# Patient Record
Sex: Male | Born: 1995 | Race: White | Hispanic: No | Marital: Single | Smoking: Current some day smoker
Health system: Southern US, Community
[De-identification: ages and names within clinical notes are randomized; demographics above are authoritative.]

## PROBLEM LIST (undated history)

## (undated) DIAGNOSIS — J45909 Unspecified asthma, uncomplicated: Secondary | ICD-10-CM

---
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
## 2013-10-02 ENCOUNTER — Emergency Department: Payer: Self-pay | Admitting: Emergency Medicine

## 2013-10-05 LAB — BETA STREP CULTURE(ARMC)

## 2015-03-18 ENCOUNTER — Encounter: Payer: Self-pay | Admitting: Emergency Medicine

## 2015-03-18 ENCOUNTER — Emergency Department
Admission: EM | Admit: 2015-03-18 | Discharge: 2015-03-18 | Discharge status: Against Medical Advice | Payer: Self-pay | Attending: Emergency Medicine | Admitting: Emergency Medicine

## 2015-03-18 DIAGNOSIS — J069 Acute upper respiratory infection, unspecified: Secondary | ICD-10-CM | POA: Insufficient documentation

## 2015-03-18 DIAGNOSIS — F17200 Nicotine dependence, unspecified, uncomplicated: Secondary | ICD-10-CM | POA: Insufficient documentation

## 2015-03-18 NOTE — ED Notes (Signed)
Cold sx's for couple of days   Cough body aches

## 2016-03-30 ENCOUNTER — Emergency Department: Payer: Self-pay

## 2016-03-30 ENCOUNTER — Emergency Department
Admission: EM | Admit: 2016-03-30 | Discharge: 2016-03-30 | Disposition: A | Discharge status: Home / Self Care | Payer: Self-pay | Attending: Emergency Medicine | Admitting: Emergency Medicine

## 2016-03-30 ENCOUNTER — Encounter: Payer: Self-pay | Admitting: Emergency Medicine

## 2016-03-30 DIAGNOSIS — F17210 Nicotine dependence, cigarettes, uncomplicated: Secondary | ICD-10-CM | POA: Insufficient documentation

## 2016-03-30 DIAGNOSIS — J069 Acute upper respiratory infection, unspecified: Secondary | ICD-10-CM | POA: Insufficient documentation

## 2016-03-30 DIAGNOSIS — B9789 Other viral agents as the cause of diseases classified elsewhere: Secondary | ICD-10-CM

## 2016-03-30 NOTE — ED Provider Notes (Signed)
[facility]: [facility] Emergency Department Provider Note  ____________________________________________   First MD Initiated Contact with Patient 03/30/16 1511     (approximate)  I have reviewed the triage vital signs and the nursing notes.   HISTORY  Chief Complaint URI and Nasal Congestion    HPI Jeremy Chang is a 21 y.o. male is here with complaint of cough and congestion for the last 2 days. Patient states at times he has had some dizziness. He has been taking DayQuil infrequently for total of 2 doses. He states that he does not like taking medication as he has had family members who have "overdosed". He is unaware of any fever but states that he at times feels warm and then cold. He denies any nausea, vomiting, diarrhea. Patient is a smoker but states that he is only smoked 2 cigarettes today as opposed to his normal half pack of cigarettes per day. He denies any history of pneumonia.   History reviewed. No pertinent past medical history.  There are no active problems to display for this patient.   History reviewed. No pertinent surgical history.  Prior to Admission medications   Not on File    Allergies Patient has no known allergies.  No family history on file.  Social History Social History  Substance Use Topics  . Smoking status: Current Every Day Smoker  . Smokeless tobacco: Never Used  . Alcohol use Yes    Review of Systems Constitutional: Subjective fever/chills Eyes: No visual changes. ENT: Positive sore throat. Cardiovascular: Denies chest pain. Respiratory: Denies shortness of breath. Positive cough. Gastrointestinal: No abdominal pain.  No nausea, no vomiting.  No diarrhea.   Musculoskeletal: Positive generalized muscle aches. Skin: Negative for rash. Neurological: Negative for headaches, focal weakness or numbness.  10-point ROS otherwise negative.  ____________________________________________   PHYSICAL EXAM:  VITAL  SIGNS: ED Triage Vitals  Enc Vitals Group     BP 03/30/16 1503 138/76     Pulse Rate 03/30/16 1503 83     Resp 03/30/16 1503 20     Temp 03/30/16 1503 98.6 F (37 C)     Temp Source 03/30/16 1503 Oral     SpO2 03/30/16 1503 99 %     Weight 03/30/16 1504 144 lb (65.3 kg)     Height 03/30/16 1503 5\' 6"  (1.676 m)     Head Circumference --      Peak Flow --      Pain Score --      Pain Loc --      Pain Edu? --      Excl. in GC? --     Constitutional: Alert and oriented. Well appearing and in no acute distress. Eyes: Conjunctivae are normal. PERRL. EOMI. Head: Atraumatic. Nose: Moderate congestion/rhinnorhea.  EACs bilaterally are clear. TMs are dull bilaterally but without injection or erythema. Mouth/Throat: Mucous membranes are moist.  Oropharynx non-erythematous. Mild posterior drainage. Neck: No stridor.   Hematological/Lymphatic/Immunilogical: No cervical lymphadenopathy. Cardiovascular: Normal rate, regular rhythm. Grossly normal heart sounds.  Good peripheral circulation. Respiratory: Normal respiratory effort.  No retractions. Lungs CTAB. Gastrointestinal: Soft and nontender. No distention.  Musculoskeletal: Moves upper and lower extremities without any difficulty. Normal gait was noted. Neurologic:  Normal speech and language. No gross focal neurologic deficits are appreciated. No gait instability. Skin:  Skin is warm, dry and intact. No rash noted. Psychiatric: Mood and affect are normal. Speech and behavior are normal.  ____________________________________________   LABS (all labs ordered are  listed, but only abnormal results are displayed)  Labs Reviewed - No data to display   RADIOLOGY Chest x-ray per radiologist shows no active cardiopulmonary disease. I, Tommi Rumps, personally viewed and evaluated these images (plain radiographs) as part of my medical decision making, as well as reviewing the written report by the  radiologist.  ____________________________________________   PROCEDURES  Procedure(s) performed: None  Procedures  Critical Care performed: No  ____________________________________________   INITIAL IMPRESSION / ASSESSMENT AND PLAN / ED COURSE  Pertinent labs & imaging results that were available during my care of the patient were reviewed by me and considered in my medical decision making (see chart for details).    Clinical Course    Patient is continue taking DayQuil as directed by the package insert. He is to increase fluids. He is also encouraged to take ibuprofen as needed for headache or throat pain. He is aware that DayQuil already has Tylenol in it. Patient states that he needs a note to remain out of work tonight as he is supposed to work.  ____________________________________________   FINAL CLINICAL IMPRESSION(S) / ED DIAGNOSES  Final diagnoses:  Viral URI with cough      NEW MEDICATIONS STARTED DURING THIS VISIT:  New Prescriptions   No medications on file     Note:  This document was prepared using Dragon voice recognition software and may include unintentional dictation errors.    Tommi Rumps, PA-C 03/30/16 1638    Sharman Cheek, MD 04/01/16 [phone]

## 2016-03-30 NOTE — ED Triage Notes (Signed)
Bodyaches, sore throat, fatigue, sweating.

## 2016-03-30 NOTE — ED Notes (Signed)
Took dayquil at 10 am

## 2016-03-30 NOTE — Discharge Instructions (Signed)
Increase fluids. Take ibuprofen as needed for headache, muscle aches or throat pain. Continue taking your DayQuil capsules as directed. [facility] if any continued problems.

## 2016-05-14 ENCOUNTER — Encounter: Payer: Self-pay | Admitting: *Deleted

## 2016-05-14 ENCOUNTER — Emergency Department
Admission: EM | Admit: 2016-05-14 | Discharge: 2016-05-14 | Disposition: A | Discharge status: Home / Self Care | Payer: Self-pay | Attending: Emergency Medicine | Admitting: Emergency Medicine

## 2016-05-14 DIAGNOSIS — F17200 Nicotine dependence, unspecified, uncomplicated: Secondary | ICD-10-CM | POA: Insufficient documentation

## 2016-05-14 DIAGNOSIS — B349 Viral infection, unspecified: Secondary | ICD-10-CM | POA: Insufficient documentation

## 2016-05-14 DIAGNOSIS — L219 Seborrheic dermatitis, unspecified: Secondary | ICD-10-CM | POA: Insufficient documentation

## 2016-05-14 MED ORDER — GUAIFENESIN-CODEINE 100-10 MG/5ML PO SYRP: 5.0000 mL | ORAL_SOLUTION | Freq: Three times a day (TID) | ORAL | 0 refills | Status: DC | PRN

## 2016-05-14 MED ORDER — PROMETHAZINE HCL 12.5 MG PO TABS: 12.5000 mg | ORAL_TABLET | Freq: Four times a day (QID) | ORAL | 0 refills | Status: DC | PRN

## 2016-05-14 MED ORDER — IBUPROFEN 600 MG PO TABS: 600.0000 mg | ORAL_TABLET | Freq: Four times a day (QID) | ORAL | 0 refills | Status: DC | PRN

## 2016-05-14 NOTE — ED Provider Notes (Signed)
[facility] Emergency Department Provider Note  ____________________________________________  Time seen: Approximately 4:59 PM  I have reviewed the triage vital signs and the nursing notes.   HISTORY  Chief Complaint Chills and Cough   HPI Jeremy Chang is a 21 y.o. male presents to the emergency department for evaluation of nausea, body aches, chills, cough and fever. Symptoms started earlier today. He works in [employer] and has likely had exposure to influenza. He did not receive a flu shot this year. He has not taken any medications for his symptoms. He is also concerned about his dandruff. He states that it has gotten bad the last few weeks and is unsure of what to use.   History reviewed. No pertinent past medical history.  There are no active problems to display for this patient.   History reviewed. No pertinent surgical history.  Prior to Admission medications   Medication Sig Start Date End Date Taking? Authorizing Provider  guaiFENesin-codeine (ROBITUSSIN AC) 100-10 MG/5ML syrup Take 5 mLs by mouth 3 (three) times daily as needed for cough. 05/14/16   Chinita Pesterari B Melaina Howerton, FNP  ibuprofen (ADVIL,MOTRIN) 600 MG tablet Take 1 tablet (600 mg total) by mouth every 6 (six) hours as needed. 05/14/16   Chinita Pesterari B Saniyya Gau, FNP  promethazine (PHENERGAN) 12.5 MG tablet Take 1 tablet (12.5 mg total) by mouth every 6 (six) hours as needed for nausea or vomiting. 05/14/16   Chinita Pesterari B Nicholson Starace, FNP    Allergies Patient has no known allergies.  History reviewed. No pertinent family history.  Social History Social History  Substance Use Topics  . Smoking status: Current Every Day Smoker  . Smokeless tobacco: Never Used  . Alcohol use Yes    Review of Systems Constitutional: Positive for fever/chills ENT: Positive for sore throat. Cardiovascular: Denies chest pain. Respiratory: Negative for shortness of breath. Positive for cough. Gastrointestinal: Positive  for nausea,  negative for vomiting.  Negative for diarrhea.  Musculoskeletal: Positive for body aches Skin: Negative for rash. Positive for dandruff. Neurological: Positive for headaches ____________________________________________   PHYSICAL EXAM:  VITAL SIGNS: ED Triage Vitals [05/14/16 1650]  Enc Vitals Group     BP (!) 151/66     Pulse Rate 69     Resp 18     Temp 99.6 F (37.6 C)     Temp Source Oral     SpO2 100 %     Weight 130 lb (59 kg)     Height 5\' 7"  (1.702 m)     Head Circumference      Peak Flow      Pain Score      Pain Loc      Pain Edu?      Excl. in GC?     Constitutional: Alert and oriented. Acutely ill appearing and in no acute distress. Eyes: Conjunctivae are normal. EOMI. Ears: Bilateral TM are normal Nose: Nasal congestion; no rhinnorhea. Mouth/Throat: Mucous membranes are moist.  Oropharynx mildly erythematous. Tonsils appear normal. Neck: No stridor.  Lymphatic: Bilateral anterior cervical lymphadenopathy. Cardiovascular: Normal rate, regular rhythm. Grossly normal heart sounds.  Good peripheral circulation. Respiratory: Normal respiratory effort.  No retractions. Breath sounds clear to auscultation throughout. Gastrointestinal: Soft and nontender.  Musculoskeletal: FROM x 4 extremities.  Neurologic:  Normal speech and language.  Skin:  Skin is warm, dry and intact. No rash noted. Psychiatric: Mood and affect are normal. Speech and behavior are normal. ___________________________________________   LABS (all labs ordered are listed,  but only abnormal results are displayed)  Labs Reviewed - No data to display ____________________________________________  EKG  Not indicated. ____________________________________________  RADIOLOGY  Not indicated. ____________________________________________   PROCEDURES  Procedure(s) performed: None  Critical Care performed: No  ____________________________________________   INITIAL  IMPRESSION / ASSESSMENT AND PLAN / ED COURSE     Pertinent labs & imaging results that were available during my care of the patient were reviewed by me and considered in my medical decision making (see chart for details).   21 year old male presenting to the ER for evaluation of viral symptoms and seborrheic dermatitis of the scalp. He was advised to use tea tree oil shampoo and see a dermatologist if dandruff is not improving. He was advised to take Robitussin AC for cough as needed.  ____________________________________________   FINAL CLINICAL IMPRESSION(S) / ED DIAGNOSES  Final diagnoses:  Acute viral syndrome  Seborrheic dermatitis of scalp    Note:  This document was prepared using Dragon voice recognition software and may include unintentional dictation errors.     Chinita Pester, FNP 05/15/16 2257    Emily Filbert, MD 05/20/16 [phone]

## 2016-05-14 NOTE — Discharge Instructions (Signed)
Take the phenergan for nausea. Take the ibuprofen for body aches or fever. Take the Robitussin AC for cough.  Use Tea Tree Oil shampoo for dandruff.  Return to the ER for symptoms that change or worsen if unable to schedule an appointment with primary care.

## 2016-05-14 NOTE — ED Notes (Signed)
See triage note   Developed fever ,chills,body aches since yesterday   Low grade fever on arrival

## 2016-05-14 NOTE — ED Triage Notes (Signed)
States fever, chills, nausea and body aches since yesterday

## 2016-08-30 ENCOUNTER — Encounter: Payer: Self-pay | Admitting: Emergency Medicine

## 2016-08-30 ENCOUNTER — Emergency Department
Admission: EM | Admit: 2016-08-30 | Discharge: 2016-08-30 | Disposition: A | Discharge status: Home / Self Care | Payer: Self-pay | Attending: Emergency Medicine | Admitting: Emergency Medicine

## 2016-08-30 DIAGNOSIS — J069 Acute upper respiratory infection, unspecified: Secondary | ICD-10-CM | POA: Insufficient documentation

## 2016-08-30 DIAGNOSIS — F17200 Nicotine dependence, unspecified, uncomplicated: Secondary | ICD-10-CM | POA: Insufficient documentation

## 2016-08-30 LAB — POCT RAPID STREP A: STREPTOCOCCUS, GROUP A SCREEN (DIRECT): NEGATIVE

## 2016-08-30 MED ORDER — LIDOCAINE VISCOUS 2 % MT SOLN
15.0000 mL | Freq: Once | OROMUCOSAL | Status: AC
  Administered 2016-08-30: 15 mL via OROMUCOSAL
  Filled 2016-08-30: qty 15

## 2016-08-30 MED ORDER — BENZONATATE 100 MG PO CAPS: 100.0000 mg | ORAL_CAPSULE | Freq: Three times a day (TID) | ORAL | 0 refills | Status: AC | PRN

## 2016-08-30 MED ORDER — LIDOCAINE VISCOUS 2 % MT SOLN: 10.0000 mL | OROMUCOSAL | 0 refills | Status: DC | PRN

## 2016-08-30 MED ORDER — ALBUTEROL SULFATE HFA 108 (90 BASE) MCG/ACT IN AERS: 2.0000 | INHALATION_SPRAY | Freq: Four times a day (QID) | RESPIRATORY_TRACT | 0 refills | Status: DC | PRN

## 2016-08-30 MED ORDER — IPRATROPIUM-ALBUTEROL 0.5-2.5 (3) MG/3ML IN SOLN
3.0000 mL | Freq: Once | RESPIRATORY_TRACT | Status: AC
  Administered 2016-08-30: 3 mL via RESPIRATORY_TRACT
  Filled 2016-08-30: qty 3

## 2016-08-30 NOTE — ED Provider Notes (Signed)
[facility] Emergency Department Provider Note  ____________________________________________  Time seen: Approximately 10:22 PM  I have reviewed the triage vital signs and the nursing notes.   HISTORY  Chief Complaint Cough and Sore Throat    HPI Jeremy Chang is a 21 y.o. male that presents to the emergency department with one day of sore throat and nonproductive cough. He is eating and drinking normally.He has a 34 month old baby and is concerned that she will get sick. No alleviating measures have been attempted. He smokes half pack a day. He has a history of asthma and does not use any inhalers. No allergies. No fever, SOB, CP, nausea, vomiting, abdominal pain, diarrhea, constipation.    History reviewed. No pertinent past medical history.  There are no active problems to display for this patient.   History reviewed. No pertinent surgical history.  Prior to Admission medications   Medication Sig Start Date End Date Taking? Authorizing Provider  albuterol (PROVENTIL HFA;VENTOLIN HFA) 108 (90 Base) MCG/ACT inhaler Inhale 2 puffs into the lungs every 6 (six) hours as needed for wheezing or shortness of breath. 08/30/16   Enid Derry, PA-C  benzonatate (TESSALON PERLES) 100 MG capsule Take 1 capsule (100 mg total) by mouth 3 (three) times daily as needed for cough. 08/30/16 08/30/17  Enid Derry, PA-C  guaiFENesin-codeine (ROBITUSSIN AC) 100-10 MG/5ML syrup Take 5 mLs by mouth 3 (three) times daily as needed for cough. 05/14/16   Triplett, Rulon Eisenmenger B, FNP  ibuprofen (ADVIL,MOTRIN) 600 MG tablet Take 1 tablet (600 mg total) by mouth every 6 (six) hours as needed. 05/14/16   Triplett, Cari B, FNP  lidocaine (XYLOCAINE) 2 % solution Use as directed 10 mLs in the mouth or throat as needed for mouth pain. 08/30/16   Enid Derry, PA-C  promethazine (PHENERGAN) 12.5 MG tablet Take 1 tablet (12.5 mg total) by mouth every 6 (six) hours as needed for nausea or  vomiting. 05/14/16   Kem Boroughs B, FNP    Allergies Patient has no known allergies.  History reviewed. No pertinent family history.  Social History Social History  Substance Use Topics  . Smoking status: Current Every Day Smoker  . Smokeless tobacco: Never Used  . Alcohol use Yes     Review of Systems  Constitutional: No fever/chills Eyes: No visual changes. No discharge. ENT: Negative for congestion and rhinorrhea. Cardiovascular: No chest pain. Respiratory: Positive for cough. No SOB. Gastrointestinal: No abdominal pain.  No nausea, no vomiting.  No diarrhea.  No constipation. Musculoskeletal: Negative for musculoskeletal pain. Skin: Negative for rash, abrasions, lacerations, ecchymosis. Neurological: Negative for headaches.   ____________________________________________   PHYSICAL EXAM:  VITAL SIGNS: ED Triage Vitals  Enc Vitals Group     BP 08/30/16 2138 113/78     Pulse Rate 08/30/16 2138 76     Resp 08/30/16 2138 18     Temp 08/30/16 2138 98.4 F (36.9 C)     Temp Source 08/30/16 2138 Oral     SpO2 08/30/16 2138 99 %     Weight 08/30/16 2139 135 lb (61.2 kg)     Height 08/30/16 2139 5\' 6"  (1.676 m)     Head Circumference --      Peak Flow --      Pain Score 08/30/16 2138 3     Pain Loc --      Pain Edu? --      Excl. in GC? --      Constitutional: Alert and  oriented. Well appearing and in no acute distress. Eyes: Conjunctivae are normal. PERRL. EOMI. No discharge. Head: Atraumatic. ENT: No frontal and maxillary sinus tenderness.      Ears: Tympanic membranes pearly gray with good landmarks. No discharge.      Nose: No congestion/rhinnorhea.      Mouth/Throat: Mucous membranes are moist. Oropharynx non-erythematous. Tonsils not enlarged. No exudates. Uvula midline. Neck: No stridor.   Hematological/Lymphatic/Immunilogical: No cervical lymphadenopathy. Cardiovascular: Normal rate, regular rhythm.  Good peripheral circulation. Respiratory: Normal  respiratory effort without tachypnea or retractions. Lungs CTAB. Good air entry to the bases with no decreased or absent breath sounds. Gastrointestinal: Bowel sounds 4 quadrants. Soft and nontender to palpation. No guarding or rigidity. No palpable masses. No distention. Musculoskeletal: Full range of motion to all extremities. No gross deformities appreciated. Neurologic:  Normal speech and language. No gross focal neurologic deficits are appreciated.  Skin:  Skin is warm, dry and intact. No rash noted.   ____________________________________________   LABS (all labs ordered are listed, but only abnormal results are displayed)  Labs Reviewed  CULTURE, GROUP A STREP [facility])  POCT RAPID STREP A   ____________________________________________  EKG   ____________________________________________  RADIOLOGY  No results found.  ____________________________________________    PROCEDURES  Procedure(s) performed:    Procedures    Medications  ipratropium-albuterol (DUONEB) 0.5-2.5 (3) MG/3ML nebulizer solution 3 mL (3 mLs Nebulization Given 08/30/16 2255)  lidocaine (XYLOCAINE) 2 % viscous mouth solution 15 mL (15 mLs Mouth/Throat Given 08/30/16 2312)     ____________________________________________   INITIAL IMPRESSION / ASSESSMENT AND PLAN / ED COURSE  Pertinent labs & imaging results that were available during my care of the patient were reviewed by me and considered in my medical decision making (see chart for details).  Review of the [place] CSRS was performed in accordance of the [facility] prior to dispensing any controlled drugs.     Patient's diagnosis is consistent with upper respiratory tract infection. Vital signs and exam are reassuring. Strep test negative. This is likely viral. Patient was given DuoNeb and viscous lidocaine. He appears well, and he is eating a popsicle. Patient feels comfortable going home. Patient will be discharged home with prescriptions for  Tessalon Perles, viscous lidocaine, albuterol inhaler. Patient is to follow up with PCP as needed or otherwise directed. Patient is given ED precautions to return to the ED for any worsening or new symptoms.     ____________________________________________  FINAL CLINICAL IMPRESSION(S) / ED DIAGNOSES  Final diagnoses:  Viral upper respiratory tract infection      NEW MEDICATIONS STARTED DURING THIS VISIT:  Discharge Medication List as of 08/30/2016 10:39 PM    START taking these medications   Details  albuterol (PROVENTIL HFA;VENTOLIN HFA) 108 (90 Base) MCG/ACT inhaler Inhale 2 puffs into the lungs every 6 (six) hours as needed for wheezing or shortness of breath., Starting Sun 08/30/2016, Print    benzonatate (TESSALON PERLES) 100 MG capsule Take 1 capsule (100 mg total) by mouth 3 (three) times daily as needed for cough., Starting Sun 08/30/2016, Until Mon 08/30/2017, Print    lidocaine (XYLOCAINE) 2 % solution Use as directed 10 mLs in the mouth or throat as needed for mouth pain., Starting Sun 08/30/2016, Print            This chart was dictated using voice recognition software/Dragon. Despite best efforts to proofread, errors can occur which can change the meaning. Any change was purely unintentional.    Enid Derry, PA-C 08/30/16  2330    Rockne MenghiniNorman, Anne-Caroline, MD 08/30/16 [phone]

## 2016-08-30 NOTE — ED Triage Notes (Signed)
Pt ambulatory to triage with no difficulty. Pt reports started this morning with a dry hacking cough. Then developed a sore throat. Pt talking in full and complete sentences with no difficulty at this time.

## 2016-08-30 NOTE — ED Notes (Signed)
POCT Rapid Strep A completed and sent for analysis.

## 2016-09-03 LAB — CULTURE, GROUP A STREP (THRC)

## 2016-10-02 ENCOUNTER — Emergency Department
Admission: EM | Admit: 2016-10-02 | Discharge: 2016-10-02 | Disposition: A | Discharge status: Home / Self Care | Payer: Self-pay | Attending: Emergency Medicine | Admitting: Emergency Medicine

## 2016-10-02 DIAGNOSIS — F17200 Nicotine dependence, unspecified, uncomplicated: Secondary | ICD-10-CM | POA: Insufficient documentation

## 2016-10-02 DIAGNOSIS — J45909 Unspecified asthma, uncomplicated: Secondary | ICD-10-CM | POA: Insufficient documentation

## 2016-10-02 DIAGNOSIS — J069 Acute upper respiratory infection, unspecified: Secondary | ICD-10-CM | POA: Insufficient documentation

## 2016-10-02 HISTORY — DX: Unspecified asthma, uncomplicated: J45.909

## 2016-10-02 NOTE — ED Triage Notes (Signed)
-  pt c/o sore throat for the past couple of days, states every time he goes to the pool this happens.

## 2016-10-02 NOTE — ED Provider Notes (Signed)
[facility] Emergency Department Provider Note  ____________________________________________  Time seen: Approximately 5:40 PM  I have reviewed the triage vital signs and the nursing notes.   HISTORY  Chief Complaint Sore Throat    HPI Jeremy Chang is a 21 y.o. male presenting to the emergency department with pharyngitis that started today. Patient has been drinking and managing his own secretions. He has been afebrile. Patient denies shortness of breath, chest pain, chest tightness, nausea, vomiting or abdominal pain. He denies rhinorrhea and congestion. He has had nonproductive cough. No alleviating measures have been attempted.   Past Medical History:  Diagnosis Date  . Asthma     There are no active problems to display for this patient.   History reviewed. No pertinent surgical history.  Prior to Admission medications   Medication Sig Start Date End Date Taking? Authorizing Provider  albuterol (PROVENTIL HFA;VENTOLIN HFA) 108 (90 Base) MCG/ACT inhaler Inhale 2 puffs into the lungs every 6 (six) hours as needed for wheezing or shortness of breath. 08/30/16   Enid Derry, PA-C  benzonatate (TESSALON PERLES) 100 MG capsule Take 1 capsule (100 mg total) by mouth 3 (three) times daily as needed for cough. 08/30/16 08/30/17  Enid Derry, PA-C  guaiFENesin-codeine (ROBITUSSIN AC) 100-10 MG/5ML syrup Take 5 mLs by mouth 3 (three) times daily as needed for cough. 05/14/16   Triplett, Rulon Eisenmenger B, FNP  ibuprofen (ADVIL,MOTRIN) 600 MG tablet Take 1 tablet (600 mg total) by mouth every 6 (six) hours as needed. 05/14/16   Triplett, Cari B, FNP  lidocaine (XYLOCAINE) 2 % solution Use as directed 10 mLs in the mouth or throat as needed for mouth pain. 08/30/16   Enid Derry, PA-C  promethazine (PHENERGAN) 12.5 MG tablet Take 1 tablet (12.5 mg total) by mouth every 6 (six) hours as needed for nausea or vomiting. 05/14/16   Kem Boroughs B, FNP    Allergies Patient  has no known allergies.  No family history on file.  Social History Social History  Substance Use Topics  . Smoking status: Current Every Day Smoker  . Smokeless tobacco: Never Used  . Alcohol use Yes     Review of Systems  Constitutional: No fever/chills Eyes: No visual changes. No discharge ENT: Patient has pharyngitis and nonproductive cough. Cardiovascular: no chest pain. Respiratory: no cough. No SOB. Gastrointestinal: No abdominal pain.  No nausea, no vomiting.  No diarrhea.  No constipation. Genitourinary: Negative for dysuria. No hematuria Musculoskeletal: Negative for musculoskeletal pain. Skin: Negative for rash, abrasions, lacerations, ecchymosis. ____________________________________________   PHYSICAL EXAM:  VITAL SIGNS: ED Triage Vitals  Enc Vitals Group     BP 10/02/16 1703 (!) 116/52     Pulse Rate 10/02/16 1703 65     Resp 10/02/16 1703 16     Temp 10/02/16 1703 98.7 F (37.1 C)     Temp Source 10/02/16 1703 Oral     SpO2 10/02/16 1703 100 %     Weight 10/02/16 1704 135 lb (61.2 kg)     Height 10/02/16 1704 5\' 5"  (1.651 m)     Head Circumference --      Peak Flow --      Pain Score 10/02/16 1703 3     Pain Loc --      Pain Edu? --      Excl. in GC? --      Constitutional: Alert and oriented. Well appearing and in no acute distress. Eyes: Conjunctivae are normal. PERRL. EOMI. Head: Atraumatic.  ENT:      Ears: Tympanic membranes are pearly bilaterally.      Nose: No congestion/rhinnorhea.      Mouth/Throat: Posterior pharynx is nonerythematous. Uvula is midline.  Neck: Full range of motion Hematological/Lymphatic/Immunilogical: No cervical lymphadenopathy.  Cardiovascular: Normal rate, regular rhythm. Normal S1 and S2.  Good peripheral circulation. Respiratory: Normal respiratory effort without tachypnea or retractions. Lungs CTAB. Good air entry to the bases with no decreased or absent breath sounds. Gastrointestinal: Bowel sounds 4  quadrants. Soft and nontender to palpation. No guarding or rigidity. No palpable masses. No distention. No CVA tenderness. Musculoskeletal: Full range of motion to all extremities. No gross deformities appreciated. Neurologic:  Normal speech and language. No gross focal neurologic deficits are appreciated.  Skin:  Skin is warm, dry and intact. No rash noted. Psychiatric: Mood and affect are normal. Speech and behavior are normal. Patient exhibits appropriate insight and judgement.   ____________________________________________   LABS (all labs ordered are listed, but only abnormal results are displayed)  Labs Reviewed - No data to display ____________________________________________  EKG   ____________________________________________  RADIOLOGY   No results found.  ____________________________________________    PROCEDURES  Procedure(s) performed:    Procedures    Medications - No data to display   ____________________________________________   INITIAL IMPRESSION / ASSESSMENT AND PLAN / ED COURSE  Pertinent labs & imaging results that were available during my care of the patient were reviewed by me and considered in my medical decision making (see chart for details).  Review of the [place] CSRS was performed in accordance of the [facility] prior to dispensing any controlled drugs.     Assessment and plan: Viral URI Patient presents to the emergency department with pharyngitis and nonproductive cough for one day. Patient has been afebrile with reassuring physical exam. Rest and hydration were encouraged. Patient was advised to follow-up with primary care as needed. All patient questions were answered.   ____________________________________________  FINAL CLINICAL IMPRESSION(S) / ED DIAGNOSES  Final diagnoses:  Viral upper respiratory tract infection      NEW MEDICATIONS STARTED DURING THIS VISIT:  New Prescriptions   No medications on file        This  chart was dictated using voice recognition software/Dragon. Despite best efforts to proofread, errors can occur which can change the meaning. Any change was purely unintentional.    Orvil FeilWoods, Emira Eubanks M, PA-C 10/02/16 1746    Pia MauWoods, Lillian Tigges [place]ite HillM, PA-C 10/02/16 1750    Sharman CheekStafford, Phillip, MD 10/06/16 [phone]

## 2016-11-06 ENCOUNTER — Emergency Department
Admission: EM | Admit: 2016-11-06 | Discharge: 2016-11-06 | Disposition: A | Discharge status: Home / Self Care | Payer: Self-pay | Attending: Emergency Medicine | Admitting: Emergency Medicine

## 2016-11-06 ENCOUNTER — Encounter: Payer: Self-pay | Admitting: Emergency Medicine

## 2016-11-06 DIAGNOSIS — S8391XA Sprain of unspecified site of right knee, initial encounter: Secondary | ICD-10-CM | POA: Insufficient documentation

## 2016-11-06 DIAGNOSIS — Y929 Unspecified place or not applicable: Secondary | ICD-10-CM | POA: Insufficient documentation

## 2016-11-06 DIAGNOSIS — J45909 Unspecified asthma, uncomplicated: Secondary | ICD-10-CM | POA: Insufficient documentation

## 2016-11-06 DIAGNOSIS — F17200 Nicotine dependence, unspecified, uncomplicated: Secondary | ICD-10-CM | POA: Insufficient documentation

## 2016-11-06 DIAGNOSIS — Y9301 Activity, walking, marching and hiking: Secondary | ICD-10-CM | POA: Insufficient documentation

## 2016-11-06 DIAGNOSIS — W19XXXA Unspecified fall, initial encounter: Secondary | ICD-10-CM

## 2016-11-06 DIAGNOSIS — Y999 Unspecified external cause status: Secondary | ICD-10-CM | POA: Insufficient documentation

## 2016-11-06 DIAGNOSIS — S8991XA Unspecified injury of right lower leg, initial encounter: Secondary | ICD-10-CM

## 2016-11-06 DIAGNOSIS — W010XXA Fall on same level from slipping, tripping and stumbling without subsequent striking against object, initial encounter: Secondary | ICD-10-CM | POA: Insufficient documentation

## 2016-11-06 MED ORDER — IBUPROFEN 600 MG PO TABS: 600.0000 mg | ORAL_TABLET | Freq: Four times a day (QID) | ORAL | 0 refills | Status: DC | PRN

## 2016-11-06 MED ORDER — CYCLOBENZAPRINE HCL 5 MG PO TABS: 5.0000 mg | ORAL_TABLET | Freq: Three times a day (TID) | ORAL | 0 refills | Status: AC | PRN

## 2016-11-06 NOTE — ED Triage Notes (Signed)
C/O leg and back cramps x 1 day.  States fell off of girlfriends porch last night (2-3 feet) and woke up this morning with leg and back pain.

## 2016-11-06 NOTE — ED Notes (Signed)
Pt. Going home with friend 

## 2016-11-06 NOTE — ED Provider Notes (Signed)
[facility] Emergency Department Provider Note  ____________________________________________  Time seen: Approximately 5:26 PM  I have reviewed the triage vital signs and the nursing notes.   HISTORY  Chief Complaint Fall    HPI Jeremy Chang is a 21 y.o. male that presents to the emergency department with low back pain and right upper leg pain for one day. Patient states that he fell off of his girlfriend's porch (about 2-3 feet) last night. He originally did not have any pain.He is having pain in the back of his leg that is worse with bending the knee. It feels like the back of his leg as cramping.  He has been walking around but with pain. He has been rubbing icy hot on his back, which is helping. He is concerned that he tore a ligament in his knee. He did not hit his head or lose consciousness.  No bowel or bladder dysfunction or saddle paresthesias. No shortness breath, chest pain, nausea, vomiting, abdominal pain.   Past Medical History:  Diagnosis Date  . Asthma     There are no active problems to display for this patient.   History reviewed. No pertinent surgical history.  Prior to Admission medications   Medication Sig Start Date End Date Taking? Authorizing Provider  albuterol (PROVENTIL HFA;VENTOLIN HFA) 108 (90 Base) MCG/ACT inhaler Inhale 2 puffs into the lungs every 6 (six) hours as needed for wheezing or shortness of breath. 08/30/16   Enid Derry, PA-C  benzonatate (TESSALON PERLES) 100 MG capsule Take 1 capsule (100 mg total) by mouth 3 (three) times daily as needed for cough. 08/30/16 08/30/17  Enid Derry, PA-C  cyclobenzaprine (FLEXERIL) 5 MG tablet Take 1 tablet (5 mg total) by mouth 3 (three) times daily as needed for muscle spasms. 11/06/16 11/13/16  Enid Derry, PA-C  guaiFENesin-codeine (ROBITUSSIN AC) 100-10 MG/5ML syrup Take 5 mLs by mouth 3 (three) times daily as needed for cough. 05/14/16   Triplett, Rulon Eisenmenger B, FNP  ibuprofen  (ADVIL,MOTRIN) 600 MG tablet Take 1 tablet (600 mg total) by mouth every 6 (six) hours as needed. 11/06/16   Enid Derry, PA-C  lidocaine (XYLOCAINE) 2 % solution Use as directed 10 mLs in the mouth or throat as needed for mouth pain. 08/30/16   Enid Derry, PA-C  promethazine (PHENERGAN) 12.5 MG tablet Take 1 tablet (12.5 mg total) by mouth every 6 (six) hours as needed for nausea or vomiting. 05/14/16   Kem Boroughs B, FNP    Allergies Patient has no known allergies.  No family history on file.  Social History Social History  Substance Use Topics  . Smoking status: Current Every Day Smoker  . Smokeless tobacco: Never Used  . Alcohol use Yes     Review of Systems  Constitutional: No fever/chills Cardiovascular: No chest pain. Respiratory:  No SOB. Gastrointestinal: No abdominal pain.  No nausea, no vomiting.  Musculoskeletal: Positive for leg pain. Skin: Negative for rash, abrasions, lacerations, ecchymosis. Neurological: Negative for headaches, numbness or tingling   ____________________________________________   PHYSICAL EXAM:  VITAL SIGNS: ED Triage Vitals  Enc Vitals Group     BP 11/06/16 1620 122/72     Pulse Rate 11/06/16 1620 80     Resp 11/06/16 1620 16     Temp 11/06/16 1620 98.8 F (37.1 C)     Temp Source 11/06/16 1620 Oral     SpO2 11/06/16 1620 97 %     Weight 11/06/16 1619 135 lb (61.2 kg)  Height 11/06/16 1619 5\' 6"  (1.676 m)     Head Circumference --      Peak Flow --      Pain Score 11/06/16 1619 5     Pain Loc --      Pain Edu? --      Excl. in GC? --      Constitutional: Alert and oriented. Well appearing and in no acute distress. Eyes: Conjunctivae are normal. PERRL. EOMI. Head: Atraumatic. ENT:      Ears:      Nose: No congestion/rhinnorhea.      Mouth/Throat: Mucous membranes are moist.  Neck: No stridor.  Cardiovascular: Normal rate, regular rhythm.  Good peripheral circulation. Respiratory: Normal respiratory effort  without tachypnea or retractions. Lungs CTAB. Good air entry to the bases with no decreased or absent breath sounds. Musculoskeletal: No gross deformities appreciated. Tenderness to palpation over right inferior hamstring, right popliteal fossa, and over right lateral knee. Pain in the back of his knee with flexion at knee. No effusion noted. Negative anterior drawer, posterior drawer, valgus, varus, mcMurray, patella apprehension, apley grind. No tenderness to palpation over lumbar spine. Tenderness to palpation over lumbar paraspinal muscles. Full range of motion of back. Neurologic:  Normal speech and language. No gross focal neurologic deficits are appreciated.  Skin:  Skin is warm, dry and intact. No rash noted.   ____________________________________________   LABS (all labs ordered are listed, but only abnormal results are displayed)  Labs Reviewed - No data to display ____________________________________________  EKG   ____________________________________________  RADIOLOGY  No results found.  ____________________________________________    PROCEDURES  Procedure(s) performed:    Procedures    Medications - No data to display   ____________________________________________   INITIAL IMPRESSION / ASSESSMENT AND PLAN / ED COURSE  Pertinent labs & imaging results that were available during my care of the patient were reviewed by me and considered in my medical decision making (see chart for details).  Review of the [place] CSRS was performed in accordance of the [facility] prior to dispensing any controlled drugs.   Patient presented to the emergency department for evaluation of back pain and leg pain after fall. Vital signs and exam are reassuring. Patient is not having any tenderness to palpation over lumbar spine. Patient did not wish to have knee or back x-rays. Knee was ace wrapped. Patient will be discharged home with prescriptions for Flexeril and ibuprofen. Patient is  to follow up with PCP as directed. Patient is given ED precautions to return to the ED for any worsening or new symptoms.     ____________________________________________  FINAL CLINICAL IMPRESSION(S) / ED DIAGNOSES  Final diagnoses:  Injury of right knee, initial encounter  Fall, initial encounter  Sprain of right knee, unspecified ligament, initial encounter      NEW MEDICATIONS STARTED DURING THIS VISIT:  Discharge Medication List as of 11/06/2016  5:39 PM    START taking these medications   Details  cyclobenzaprine (FLEXERIL) 5 MG tablet Take 1 tablet (5 mg total) by mouth 3 (three) times daily as needed for muscle spasms., Starting Fri 11/06/2016, Until Fri 11/13/2016, Print            This chart was dictated using voice recognition software/Dragon. Despite best efforts to proofread, errors can occur which can change the meaning. Any change was purely unintentional.    Enid DerryWagner, Keven Soucy, PA-C 11/07/16 1708    Jene EveryKinner, Robert, MD 11/07/16 [phone]

## 2017-03-12 IMAGING — CR DG CHEST 2V
1 series · 2 of 2 positions shown · non-contrast
Comparison: Report from 12/26/1997

CLINICAL DATA: Productive cough, dyspnea and dizziness x2 days.

EXAM:
CHEST  2 VIEW

[Series 1: dg chest 2 view · 0.14mm/px · 2 of 2 slices shown]
[im 1/2]
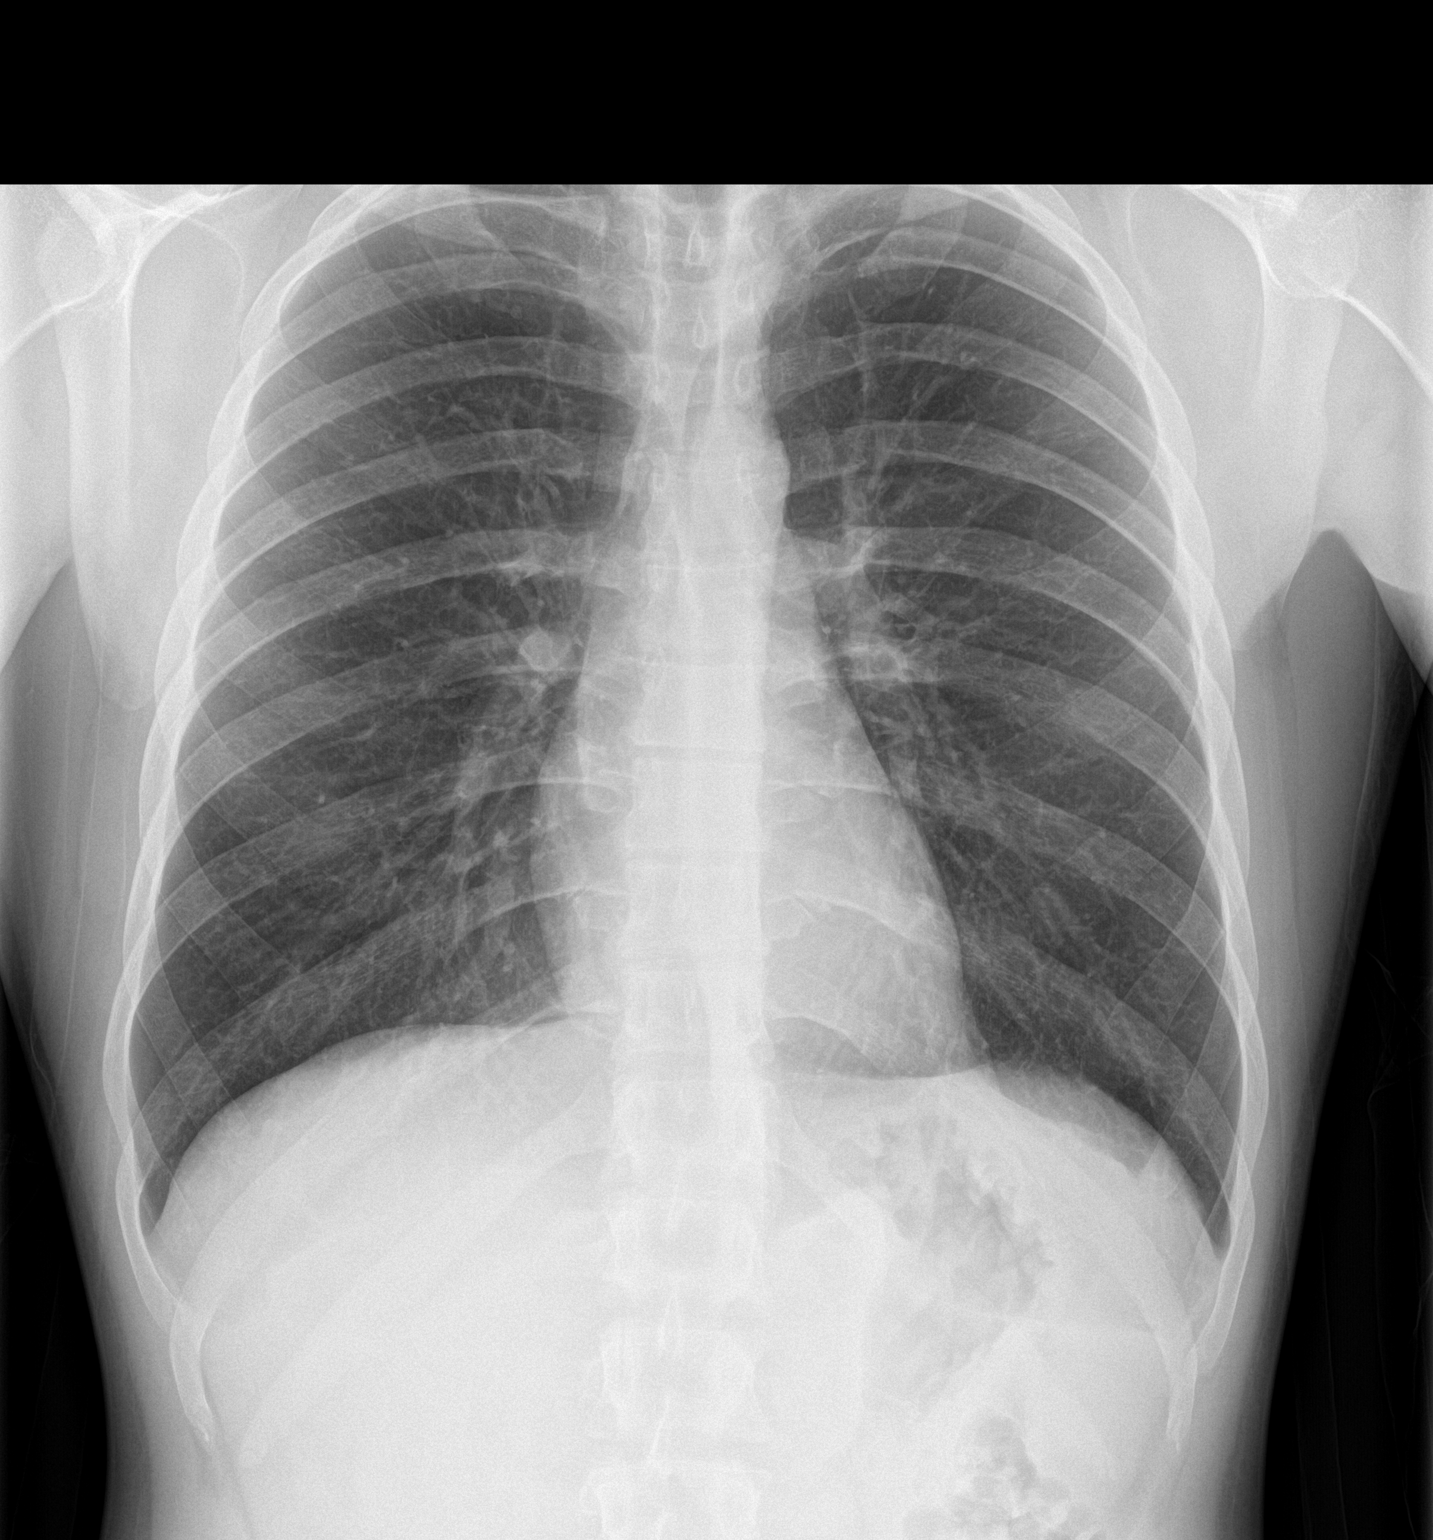
[im 2/2]
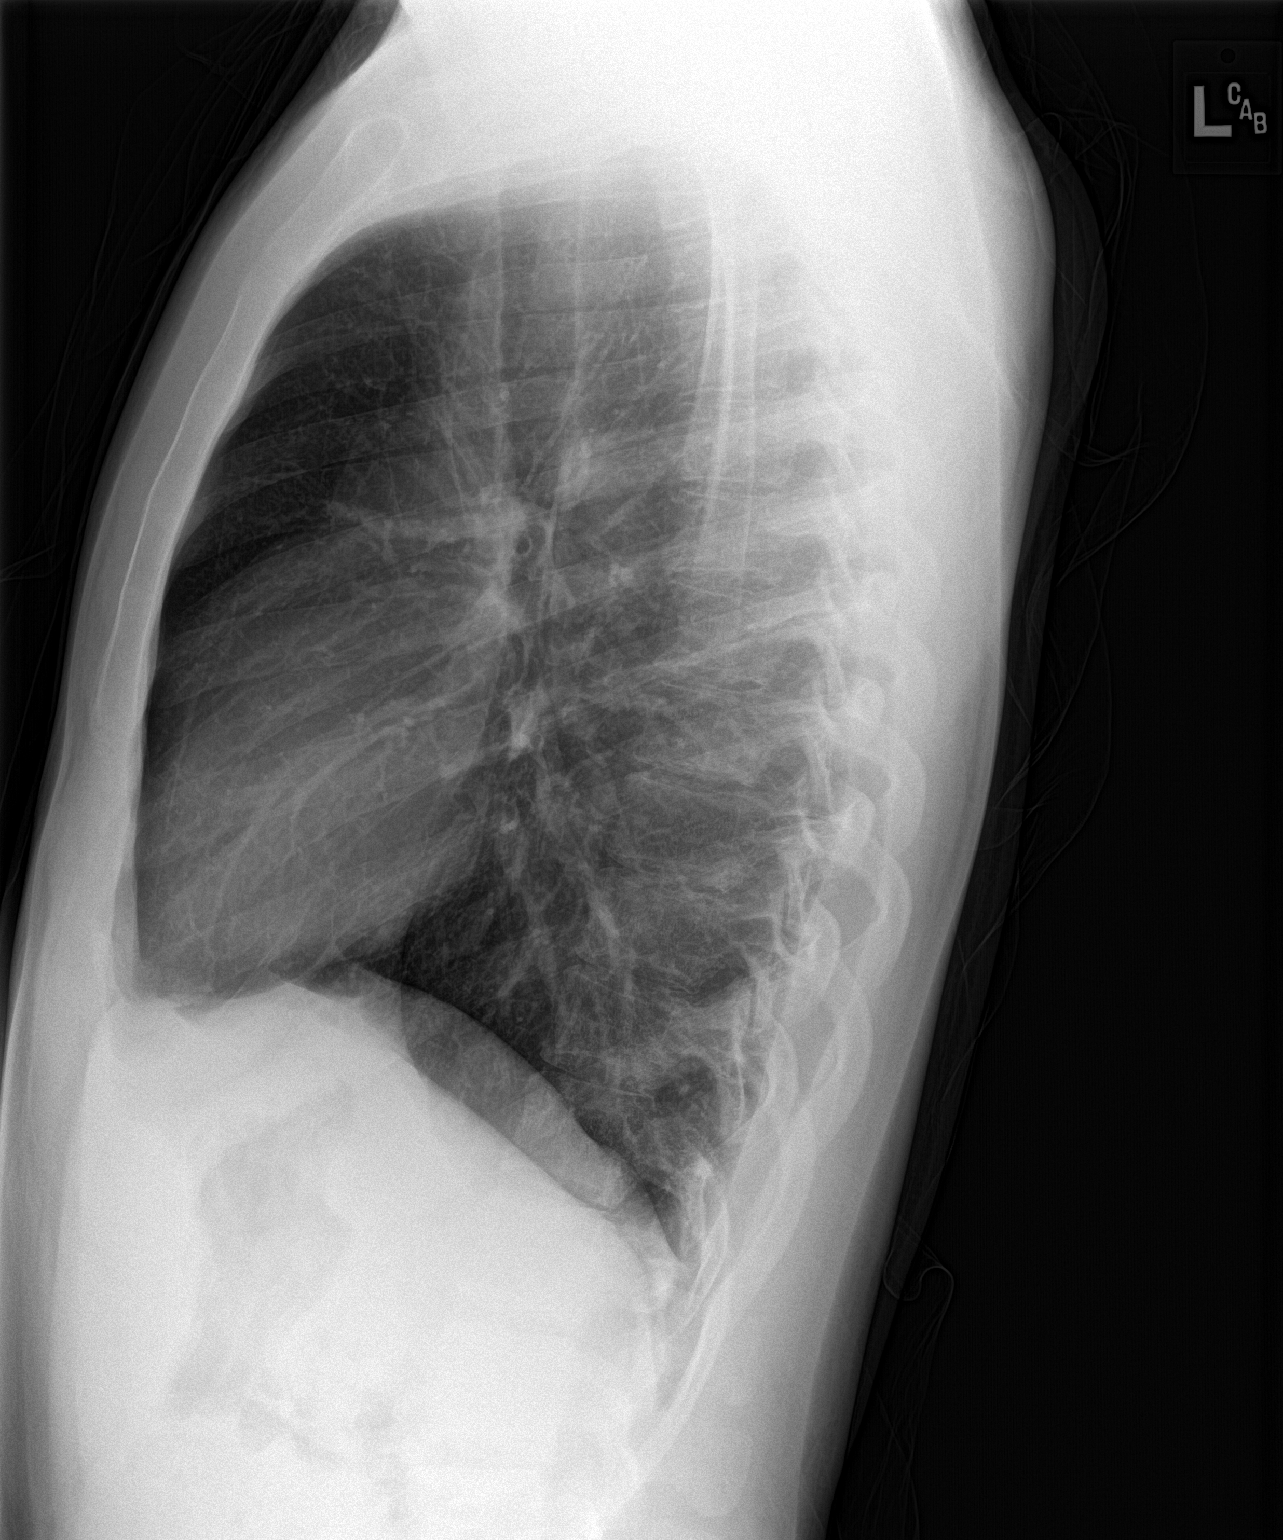

[2 of 2 positions shown; findings below may reference images not displayed]

FINDINGS: The heart size and mediastinal contours are within normal limits.
Both lungs are clear. The visualized skeletal structures are
unremarkable.
IMPRESSION: No active cardiopulmonary disease.

## 2017-03-30 ENCOUNTER — Encounter: Payer: Self-pay | Admitting: Emergency Medicine

## 2017-03-30 ENCOUNTER — Emergency Department
Admission: EM | Admit: 2017-03-30 | Discharge: 2017-03-30 | Disposition: A | Discharge status: Home / Self Care | Payer: Self-pay | Attending: Emergency Medicine | Admitting: Emergency Medicine

## 2017-03-30 DIAGNOSIS — R21 Rash and other nonspecific skin eruption: Secondary | ICD-10-CM | POA: Insufficient documentation

## 2017-03-30 DIAGNOSIS — F17200 Nicotine dependence, unspecified, uncomplicated: Secondary | ICD-10-CM | POA: Insufficient documentation

## 2017-03-30 DIAGNOSIS — J0100 Acute maxillary sinusitis, unspecified: Secondary | ICD-10-CM | POA: Insufficient documentation

## 2017-03-30 DIAGNOSIS — J45909 Unspecified asthma, uncomplicated: Secondary | ICD-10-CM | POA: Insufficient documentation

## 2017-03-30 MED ORDER — FLUTICASONE PROPIONATE 50 MCG/ACT NA SUSP: 2.0000 | Freq: Every day | NASAL | 0 refills | Status: DC

## 2017-03-30 MED ORDER — TRIAMCINOLONE ACETONIDE 0.025 % EX OINT: 1.0000 "application " | TOPICAL_OINTMENT | Freq: Two times a day (BID) | CUTANEOUS | 0 refills | Status: DC

## 2017-03-30 NOTE — ED Triage Notes (Signed)
Patient presents to ED via POV from home with c/o congestion x 3 days. Even and non labored respirations noted.

## 2017-03-30 NOTE — ED Notes (Signed)
See triage note  Presents with a 2 day hx of nasal congestion afebrile on arrival .

## 2017-03-30 NOTE — ED Provider Notes (Signed)
[facility] Emergency Department Provider Note  ____________________________________________  Time seen: Approximately 7:57 PM  I have reviewed the triage vital signs and the nursing notes.   HISTORY  Chief Complaint Nasal Congestion    HPI Jeremy Chang is a 22 y.o. male that presents to the emergency department for evaluation of nasal congestion for 2 days.  He also occasionally gets a couple of itchy red bumps on his right arm.  His daughter had a cream for a similar rash that helps.  Patient works at [employer] and needs a note for work.  No sick contacts.  He denies fever, chills, cough, shortness breath, chest pain, nausea, vomiting, abdominal pain.  Past Medical History:  Diagnosis Date  . Asthma     There are no active problems to display for this patient.   History reviewed. No pertinent surgical history.  Prior to Admission medications   Medication Sig Start Date End Date Taking? Authorizing Provider  albuterol (PROVENTIL HFA;VENTOLIN HFA) 108 (90 Base) MCG/ACT inhaler Inhale 2 puffs into the lungs every 6 (six) hours as needed for wheezing or shortness of breath. 08/30/16   Enid Derry, PA-C  benzonatate (TESSALON PERLES) 100 MG capsule Take 1 capsule (100 mg total) by mouth 3 (three) times daily as needed for cough. 08/30/16 08/30/17  Enid Derry, PA-C  fluticasone (FLONASE) 50 MCG/ACT nasal spray Place 2 sprays into both nostrils daily. 03/30/17 03/30/18  Enid Derry, PA-C  guaiFENesin-codeine (ROBITUSSIN AC) 100-10 MG/5ML syrup Take 5 mLs by mouth 3 (three) times daily as needed for cough. 05/14/16   Triplett, Rulon Eisenmenger B, FNP  ibuprofen (ADVIL,MOTRIN) 600 MG tablet Take 1 tablet (600 mg total) by mouth every 6 (six) hours as needed. 11/06/16   Enid Derry, PA-C  lidocaine (XYLOCAINE) 2 % solution Use as directed 10 mLs in the mouth or throat as needed for mouth pain. 08/30/16   Enid Derry, PA-C  promethazine (PHENERGAN) 12.5 MG tablet Take 1  tablet (12.5 mg total) by mouth every 6 (six) hours as needed for nausea or vomiting. 05/14/16   Triplett, Cari B, FNP  triamcinolone (KENALOG) 0.025 % ointment Apply 1 application topically 2 (two) times daily. 03/30/17   Enid Derry, PA-C    Allergies Patient has no known allergies.  No family history on file.  Social History Social History   Tobacco Use  . Smoking status: Current Every Day Smoker  . Smokeless tobacco: Never Used  Substance Use Topics  . Alcohol use: Yes  . Drug use: Not on file     Review of Systems  Constitutional: No fever/chills Eyes: No visual changes. No discharge. ENT: Positive for congestion and rhinorrhea. Cardiovascular: No chest pain. Respiratory: Negative for cough. No SOB. Gastrointestinal: No abdominal pain.  No nausea, no vomiting.  No diarrhea.  No constipation. Musculoskeletal: Negative for musculoskeletal pain. Skin: Negative for abrasions, lacerations, ecchymosis. Neurological: Negative for headaches.   ____________________________________________   PHYSICAL EXAM:  VITAL SIGNS: ED Triage Vitals  Enc Vitals Group     BP 03/30/17 1757 133/85     Pulse Rate 03/30/17 1755 (!) 59     Resp 03/30/17 1755 14     Temp 03/30/17 1755 97.9 F (36.6 C)     Temp Source 03/30/17 1755 Oral     SpO2 03/30/17 1755 100 %     Weight 03/30/17 1756 140 lb (63.5 kg)     Height 03/30/17 1756 5\' 6"  (1.676 m)     Head Circumference --  Peak Flow --      Pain Score --      Pain Loc --      Pain Edu? --      Excl. in GC? --      Constitutional: Alert and oriented. Well appearing and in no acute distress. Eyes: Conjunctivae are normal. PERRL. EOMI. No discharge. Head: Atraumatic. ENT: No frontal and maxillary sinus tenderness.      Ears: Tympanic membranes pearly gray with good landmarks. No discharge.      Nose: Mild congestion/rhinnorhea.      Mouth/Throat: Mucous membranes are moist. Oropharynx non-erythematous. Tonsils not enlarged. No  exudates. Uvula midline. Neck: No stridor.   Hematological/Lymphatic/Immunilogical: No cervical lymphadenopathy. Cardiovascular: Normal rate, regular rhythm.  Good peripheral circulation. Respiratory: Normal respiratory effort without tachypnea or retractions. Lungs CTAB. Good air entry to the bases with no decreased or absent breath sounds. Gastrointestinal: Bowel sounds 4 quadrants. Soft and nontender to palpation. No guarding or rigidity. No palpable masses. No distention. Musculoskeletal: Full range of motion to all extremities. No gross deformities appreciated. Neurologic:  Normal speech and language. No gross focal neurologic deficits are appreciated.  Skin:  Skin is warm, dry and intact. No rash noted.   ____________________________________________   LABS (all labs ordered are listed, but only abnormal results are displayed)  Labs Reviewed - No data to display ____________________________________________  EKG   ____________________________________________  RADIOLOGY  No results found.  ____________________________________________    PROCEDURES  Procedure(s) performed:    Procedures    Medications - No data to display   ____________________________________________   INITIAL IMPRESSION / ASSESSMENT AND PLAN / ED COURSE  Pertinent labs & imaging results that were available during my care of the patient were reviewed by me and considered in my medical decision making (see chart for details).  Review of the [place] CSRS was performed in accordance of the [facility] prior to dispensing any controlled drugs.   Patient's diagnosis is consistent with sinusitis. Vital signs and exam are reassuring. Patient appears well and is staying well hydrated. Patient feels comfortable going home. Patient will be discharged home with prescriptions for Flonase.  He will also be given triamcinolone cream for occasional rash.  Patient is to follow up with PCP as needed or otherwise  directed. Patient is given ED precautions to return to the ED for any worsening or new symptoms.     ____________________________________________  FINAL CLINICAL IMPRESSION(S) / ED DIAGNOSES  Final diagnoses:  Acute non-recurrent maxillary sinusitis  Rash      NEW MEDICATIONS STARTED DURING THIS VISIT:  ED Discharge Orders        Ordered    fluticasone (FLONASE) 50 MCG/ACT nasal spray  Daily     03/30/17 1955    triamcinolone (KENALOG) 0.025 % ointment  2 times daily     03/30/17 2004          This chart was dictated using voice recognition software/Dragon. Despite best efforts to proofread, errors can occur which can change the meaning. Any change was purely unintentional.    Enid DerryWagner, Maxxwell Edgett, PA-C 03/30/17 2018    Sharyn CreamerQuale, Mark, MD 03/30/17 [phone]

## 2017-07-09 ENCOUNTER — Encounter: Payer: Self-pay | Admitting: Emergency Medicine

## 2017-07-09 ENCOUNTER — Emergency Department
Admission: EM | Admit: 2017-07-09 | Discharge: 2017-07-09 | Disposition: A | Discharge status: Home / Self Care | Payer: Self-pay | Attending: Emergency Medicine | Admitting: Emergency Medicine

## 2017-07-09 DIAGNOSIS — J069 Acute upper respiratory infection, unspecified: Secondary | ICD-10-CM | POA: Insufficient documentation

## 2017-07-09 DIAGNOSIS — J45909 Unspecified asthma, uncomplicated: Secondary | ICD-10-CM | POA: Insufficient documentation

## 2017-07-09 DIAGNOSIS — F17210 Nicotine dependence, cigarettes, uncomplicated: Secondary | ICD-10-CM | POA: Insufficient documentation

## 2017-07-09 DIAGNOSIS — J029 Acute pharyngitis, unspecified: Secondary | ICD-10-CM | POA: Insufficient documentation

## 2017-07-09 MED ORDER — PSEUDOEPH-BROMPHEN-DM 30-2-10 MG/5ML PO SYRP: 10.0000 mL | ORAL_SOLUTION | Freq: Three times a day (TID) | ORAL | 0 refills | Status: DC | PRN

## 2017-07-09 NOTE — ED Provider Notes (Signed)
[facility] Emergency Department Provider Note  ____________________________________________  Time seen: Approximately 12:48 PM  I have reviewed the triage vital signs and the nursing notes.   HISTORY  Chief Complaint Sore Throat    HPI Jeremy Chang is a 22 y.o. male who presents to the emergency department for treatment and evaluation of cough and sore throat.  Symptoms started this morning upon awakening.  No alleviating measures have been attempted for this complaint prior to arrival.  Past Medical History:  Diagnosis Date  . Asthma     There are no active problems to display for this patient.   History reviewed. No pertinent surgical history.  Prior to Admission medications   Medication Sig Start Date End Date Taking? Authorizing Provider  albuterol (PROVENTIL HFA;VENTOLIN HFA) 108 (90 Base) MCG/ACT inhaler Inhale 2 puffs into the lungs every 6 (six) hours as needed for wheezing or shortness of breath. 08/30/16   Enid Derry, PA-C  benzonatate (TESSALON PERLES) 100 MG capsule Take 1 capsule (100 mg total) by mouth 3 (three) times daily as needed for cough. 08/30/16 08/30/17  Enid Derry, PA-C  brompheniramine-pseudoephedrine-DM 30-2-10 MG/5ML syrup Take 10 mLs by mouth 3 (three) times daily as needed. 07/09/17   Hilliard Borges B, FNP  fluticasone (FLONASE) 50 MCG/ACT nasal spray Place 2 sprays into both nostrils daily. 03/30/17 03/30/18  Enid Derry, PA-C  guaiFENesin-codeine (ROBITUSSIN AC) 100-10 MG/5ML syrup Take 5 mLs by mouth 3 (three) times daily as needed for cough. 05/14/16   Lennette Fader, Rulon Eisenmenger B, FNP  ibuprofen (ADVIL,MOTRIN) 600 MG tablet Take 1 tablet (600 mg total) by mouth every 6 (six) hours as needed. 11/06/16   Enid Derry, PA-C  lidocaine (XYLOCAINE) 2 % solution Use as directed 10 mLs in the mouth or throat as needed for mouth pain. 08/30/16   Enid Derry, PA-C  promethazine (PHENERGAN) 12.5 MG tablet Take 1 tablet (12.5 mg total)  by mouth every 6 (six) hours as needed for nausea or vomiting. 05/14/16   Danaysha Kirn B, FNP  triamcinolone (KENALOG) 0.025 % ointment Apply 1 application topically 2 (two) times daily. 03/30/17   Enid Derry, PA-C    Allergies Patient has no known allergies.  History reviewed. No pertinent family history.  Social History Social History   Tobacco Use  . Smoking status: Current Every Day Smoker  . Smokeless tobacco: Never Used  Substance Use Topics  . Alcohol use: Yes  . Drug use: Not on file    Review of Systems Constitutional: Negative for fever. Eyes: No visual changes. ENT: Positive for sore throat; negative for difficulty swallowing. Respiratory: Denies shortness of breath.  Positive for cough Gastrointestinal: No abdominal pain.  No nausea, no vomiting.  No diarrhea. Genitourinary: Negative for dysuria. Musculoskeletal: Negative for generalized body aches. Skin: Negative for rash. Neurological: Positive for headaches, negative focal weakness or numbness.  ____________________________________________   PHYSICAL EXAM:  VITAL SIGNS: ED Triage Vitals  Enc Vitals Group     BP 07/09/17 1240 124/68     Pulse Rate 07/09/17 1240 83     Resp 07/09/17 1240 17     Temp 07/09/17 1240 98.2 F (36.8 C)     Temp Source 07/09/17 1240 Oral     SpO2 07/09/17 1240 99 %     Weight 07/09/17 1238 135 lb (61.2 kg)     Height 07/09/17 1238 5\' 6"  (1.676 m)     Head Circumference --      Peak Flow --  Pain Score 07/09/17 1238 3     Pain Loc --      Pain Edu? --      Excl. in GC? --    Constitutional: Alert and oriented. Well appearing and in no acute distress. Eyes: Conjunctivae are normal.  Head: Atraumatic. Nose: No congestion/rhinnorhea. Mouth/Throat: Mucous membranes are moist.  Oropharynx erythematous, tonsils flat without exudate. Uvula is midline. Neck: No stridor.  Lymphatic: Lymphadenopathy: Tender anterior cervical nodes on exam Cardiovascular: Normal rate,  regular rhythm. Good peripheral circulation. Respiratory: Normal respiratory effort. Lungs CTAB. Gastrointestinal: Soft and nontender. Musculoskeletal: No lower extremity tenderness nor edema.  Neurologic:  Normal speech and language. No gross focal neurologic deficits are appreciated. Speech is normal. No gait instability. Skin:  Skin is warm, dry and intact. No rash noted Psychiatric: Mood and affect are normal. Speech and behavior are normal.  ____________________________________________   LABS (all labs ordered are listed, but only abnormal results are displayed)  Labs Reviewed - No data to display ____________________________________________  EKG  Not indicated ____________________________________________  RADIOLOGY  Not indicated ____________________________________________   PROCEDURES  Procedure(s) performed: None  Critical Care performed: No ____________________________________________   INITIAL IMPRESSION / ASSESSMENT AND PLAN / ED COURSE  22 year old male presenting to the emergency department for evaluation and treatment of symptoms and exams are most consistent with a viral URI.  He will be given a prescription for Bromfed and a work note to cover him for today and tomorrow.  He was instructed to follow-up with the primary care provider for choice for symptoms that are not improving over the week.  He was encouraged to return to the emergency department for symptoms of change or worsen if he is unable to schedule an appointment.  Pertinent labs & imaging results that were available during my care of the patient were reviewed by me and considered in my medical decision making (see chart for details). ____________________________________________  Discharge Medication List as of 07/09/2017 12:56 PM    START taking these medications   Details  brompheniramine-pseudoephedrine-DM 30-2-10 MG/5ML syrup Take 10 mLs by mouth 3 (three) times daily as needed., Starting  Fri 07/09/2017, Print        FINAL CLINICAL IMPRESSION(S) / ED DIAGNOSES  Final diagnoses:  Viral upper respiratory tract infection    If controlled substance prescribed during this visit, 12 month history viewed on the [place] prior to issuing an initial prescription for Schedule II or III opiod.   Note:  This document was prepared using Dragon voice recognition software and may include unintentional dictation errors.    Chinita Pesterriplett, Tyreshia Ingman B, FNP 07/09/17 1529    Nita SickleVeronese, [place], MD 07/19/17 [phone]

## 2017-07-09 NOTE — ED Triage Notes (Signed)
Sore throat this am 

## 2017-07-09 NOTE — ED Notes (Signed)
Pt reports this morning he woke up and his throat was sore, he was coughing and his nose has been runny. Pt reports was fine last night. Denies fevers. Pt reports pain to throat gets worse when he talks.

## 2017-08-13 ENCOUNTER — Other Ambulatory Visit: Payer: Self-pay

## 2017-08-13 ENCOUNTER — Emergency Department
Admission: EM | Admit: 2017-08-13 | Discharge: 2017-08-13 | Disposition: A | Discharge status: Home / Self Care | Payer: Self-pay | Attending: Emergency Medicine | Admitting: Emergency Medicine

## 2017-08-13 DIAGNOSIS — R112 Nausea with vomiting, unspecified: Secondary | ICD-10-CM | POA: Insufficient documentation

## 2017-08-13 DIAGNOSIS — F17200 Nicotine dependence, unspecified, uncomplicated: Secondary | ICD-10-CM | POA: Insufficient documentation

## 2017-08-13 DIAGNOSIS — R1010 Upper abdominal pain, unspecified: Secondary | ICD-10-CM

## 2017-08-13 DIAGNOSIS — J45909 Unspecified asthma, uncomplicated: Secondary | ICD-10-CM | POA: Insufficient documentation

## 2017-08-13 DIAGNOSIS — R5383 Other fatigue: Secondary | ICD-10-CM | POA: Insufficient documentation

## 2017-08-13 LAB — URINALYSIS, COMPLETE (UACMP) WITH MICROSCOPIC
BILIRUBIN URINE: NEGATIVE
Glucose, UA: NEGATIVE mg/dL
Hgb urine dipstick: NEGATIVE
KETONES UR: NEGATIVE mg/dL
Leukocytes, UA: NEGATIVE
Nitrite: NEGATIVE
PH: 7 (ref 5.0–8.0)
PROTEIN: NEGATIVE mg/dL
SQUAMOUS EPITHELIAL / LPF: NONE SEEN (ref 0–5)
Specific Gravity, Urine: 1.02 (ref 1.005–1.030)

## 2017-08-13 NOTE — ED Triage Notes (Signed)
Pt states he woke up this AM and vomited once. States he missed work today. States generalized abd pain. Denies diarrhea. Still has belly organs. Denies urinary symptoms. Alert, oriented, ambulatory.   Pt is refusing blood draw. Explained to pt that we need lab work to determine what is going on with him and that we need it to do any scans. Pt still refusing.

## 2017-08-13 NOTE — ED Provider Notes (Signed)
[facility] Emergency Department Provider Note  ___________________________________________   First MD Initiated Contact with Patient 08/13/17 1955     (approximate)  I have reviewed the triage vital signs and the nursing notes.   HISTORY  Chief Complaint Abdominal Pain  HPI Jeremy Chang is a 22 y.o. male with a history of constipation was presented to the emergency department with upper abdominal pain and 2 episodes of vomiting this morning.  He says that he became very fatigued last night and fell asleep very early.  When he woke up this morning he felt clammy and then vomited twice.  He had associated upper abdominal pain.  However he says that he is not having any constipation at this time.  Had a normal bowel movement this afternoon.  Says that he is concerned about incorporating different foods into his diet including new vegetables which he has previously not eaten.  He says that he let us and 2 pieces of pizza last night.  Does not know anyone else he became sick after eating food with him.  Says that he now feels fine.  Denies any abdominal pain but no nausea.  Denies any burning in his chest.  Denies any burning with urination or urinary frequency.   Past Medical History:  Diagnosis Date  . Asthma     There are no active problems to display for this patient.   History reviewed. No pertinent surgical history.  Prior to Admission medications   Medication Sig Start Date End Date Taking? Authorizing Provider  albuterol (PROVENTIL HFA;VENTOLIN HFA) 108 (90 Base) MCG/ACT inhaler Inhale 2 puffs into the lungs every 6 (six) hours as needed for wheezing or shortness of breath. 08/30/16   Enid Derry, PA-C  benzonatate (TESSALON PERLES) 100 MG capsule Take 1 capsule (100 mg total) by mouth 3 (three) times daily as needed for cough. 08/30/16 08/30/17  Enid Derry, PA-C  brompheniramine-pseudoephedrine-DM 30-2-10 MG/5ML syrup Take 10 mLs by mouth 3  (three) times daily as needed. 07/09/17   Triplett, Cari B, FNP  fluticasone (FLONASE) 50 MCG/ACT nasal spray Place 2 sprays into both nostrils daily. 03/30/17 03/30/18  Enid Derry, PA-C  guaiFENesin-codeine (ROBITUSSIN AC) 100-10 MG/5ML syrup Take 5 mLs by mouth 3 (three) times daily as needed for cough. 05/14/16   Triplett, Rulon Eisenmenger B, FNP  ibuprofen (ADVIL,MOTRIN) 600 MG tablet Take 1 tablet (600 mg total) by mouth every 6 (six) hours as needed. 11/06/16   Enid Derry, PA-C  lidocaine (XYLOCAINE) 2 % solution Use as directed 10 mLs in the mouth or throat as needed for mouth pain. 08/30/16   Enid Derry, PA-C  promethazine (PHENERGAN) 12.5 MG tablet Take 1 tablet (12.5 mg total) by mouth every 6 (six) hours as needed for nausea or vomiting. 05/14/16   Triplett, Cari B, FNP  triamcinolone (KENALOG) 0.025 % ointment Apply 1 application topically 2 (two) times daily. 03/30/17   Enid Derry, PA-C    Allergies Patient has no known allergies.  History reviewed. No pertinent family history.  Social History Social History   Tobacco Use  . Smoking status: Current Every Day Smoker  . Smokeless tobacco: Never Used  Substance Use Topics  . Alcohol use: Yes  . Drug use: Not on file    Review of Systems  Constitutional: No fever/chills Eyes: No visual changes. ENT: No sore throat. Cardiovascular: Denies chest pain. Respiratory: Denies shortness of breath. Gastrointestinal:  No diarrhea.  No constipation. Genitourinary: Negative for dysuria. Musculoskeletal: Negative for  back pain. Skin: Negative for rash. Neurological: Negative for headaches, focal weakness or numbness.   ____________________________________________   PHYSICAL EXAM:  VITAL SIGNS: ED Triage Vitals  Enc Vitals Group     BP 08/13/17 1855 (!) 148/78     Pulse Rate 08/13/17 1855 (!) 102     Resp 08/13/17 1855 18     Temp 08/13/17 1855 98.8 F (37.1 C)     Temp Source 08/13/17 1855 Oral     SpO2 08/13/17 1855 98 %      Weight 08/13/17 1856 130 lb (59 kg)     Height 08/13/17 1856  (1.676 m)     Head Circumference --      Peak Flow --      Pain Score 08/13/17 1855 2     Pain Loc --      Pain Edu? --      Excl. in GC? --     Constitutional: Alert and oriented. Well appearing and in no acute distress. Eyes: Conjunctivae are normal.  Head: Atraumatic. Nose: No congestion/rhinnorhea. Mouth/Throat: Mucous membranes are moist.  Neck: No stridor.   Cardiovascular: Normal rate, regular rhythm. Grossly normal heart sounds.   Respiratory: Normal respiratory effort.  No retractions. Lungs CTAB. Gastrointestinal: Soft and nontender. No distention. No CVA tenderness. Musculoskeletal: No lower extremity tenderness nor edema.  No joint effusions. Neurologic:  Normal speech and language. No gross focal neurologic deficits are appreciated. Skin:  Skin is warm, dry and intact. No rash noted. Psychiatric: Mood and affect are normal. Speech and behavior are normal.  ____________________________________________   LABS (all labs ordered are listed, but only abnormal results are displayed)  Labs Reviewed  LIPASE, BLOOD  COMPREHENSIVE METABOLIC PANEL  CBC  URINALYSIS, COMPLETE (UACMP) WITH MICROSCOPIC   ____________________________________________  EKG   ____________________________________________  RADIOLOGY   ____________________________________________   PROCEDURES  Procedure(s) performed:   Procedures  Critical Care performed:   ____________________________________________   INITIAL IMPRESSION / ASSESSMENT AND PLAN / ED COURSE  Pertinent labs & imaging results that were available during my care of the patient were reviewed by me and considered in my medical decision making (see chart for details).  Differential diagnosis includes, but is not limited to, biliary disease (biliary colic, acute cholecystitis, cholangitis, choledocholithiasis, etc), intrathoracic causes for epigastric  abdominal pain including ACS, gastritis, duodenitis, pancreatitis, small bowel or large bowel obstruction, abdominal aortic aneurysm, hernia, and ulcer(s). As part of my medical decision making, I reviewed the following data within the electronic MEDICAL RECORD NUMBER Notes from prior ED visits  Patient without any symptoms at this time.  Says the last time he vomited was this a.m.  Nontender abdomen.  Refusing blood work because he says that he fainted in the past with blood draws.  Will be discharged at this time.  Will be given follow-up information for primary care.  Suspecting viral enteritis versus food poisoning for the cause of the illness this morning. ____________________________________________   FINAL CLINICAL IMPRESSION(S) / ED DIAGNOSES  Vomiting.  Upper abdominal pain.    NEW MEDICATIONS STARTED DURING THIS VISIT:  New Prescriptions   No medications on file     Note:  This document was prepared using Dragon voice recognition software and may include unintentional dictation errors.     Myrna Blazer, MD 08/13/17 2052

## 2017-08-13 NOTE — ED Notes (Signed)
Pt refused lab work and IV 

## 2017-09-05 ENCOUNTER — Emergency Department
Admission: EM | Admit: 2017-09-05 | Discharge: 2017-09-05 | Disposition: A | Discharge status: Home / Self Care | Payer: Self-pay | Attending: Emergency Medicine | Admitting: Emergency Medicine

## 2017-09-05 ENCOUNTER — Encounter: Payer: Self-pay | Admitting: Emergency Medicine

## 2017-09-05 DIAGNOSIS — J45909 Unspecified asthma, uncomplicated: Secondary | ICD-10-CM | POA: Insufficient documentation

## 2017-09-05 DIAGNOSIS — Z79899 Other long term (current) drug therapy: Secondary | ICD-10-CM | POA: Insufficient documentation

## 2017-09-05 DIAGNOSIS — K529 Noninfective gastroenteritis and colitis, unspecified: Secondary | ICD-10-CM | POA: Insufficient documentation

## 2017-09-05 DIAGNOSIS — F17200 Nicotine dependence, unspecified, uncomplicated: Secondary | ICD-10-CM | POA: Insufficient documentation

## 2017-09-05 MED ORDER — ONDANSETRON 4 MG PO TBDP: 4.0000 mg | ORAL_TABLET | Freq: Three times a day (TID) | ORAL | 0 refills | Status: DC | PRN

## 2017-09-05 NOTE — ED Provider Notes (Signed)
[facility] Emergency Department Provider Note  ____________________________________________  Time seen: Approximately 12:10 PM  I have reviewed the triage vital signs and the nursing notes.   HISTORY  Chief Complaint Emesis and Diarrhea    HPI Jeremy Chang is a 22 y.o. male that presents to the emergency department for evaluation of one episode of diarrhea and one episode of vomiting yesterday.  Patient's daughter was diagnosed with a viral gastroenteritis 3 days ago.  He started having similar symptoms yesterday.  He came to the emergency department last night but left due to the wait.  He ate a chicken sandwich last night for dinner without vomiting.  He states that he is drinking plenty of water without difficulty.  He had a soda this morning.  He is unsure of fever.  No abdominal pain.  Past Medical History:  Diagnosis Date  . Asthma     There are no active problems to display for this patient.   History reviewed. No pertinent surgical history.  Prior to Admission medications   Medication Sig Start Date End Date Taking? Authorizing Provider  albuterol (PROVENTIL HFA;VENTOLIN HFA) 108 (90 Base) MCG/ACT inhaler Inhale 2 puffs into the lungs every 6 (six) hours as needed for wheezing or shortness of breath. 08/30/16   Enid DerryWagner, Malyk Girouard, PA-C  brompheniramine-pseudoephedrine-DM 30-2-10 MG/5ML syrup Take 10 mLs by mouth 3 (three) times daily as needed. 07/09/17   Triplett, Cari B, FNP  fluticasone (FLONASE) 50 MCG/ACT nasal spray Place 2 sprays into both nostrils daily. 03/30/17 03/30/18  Enid DerryWagner, Makia Bossi, PA-C  guaiFENesin-codeine (ROBITUSSIN AC) 100-10 MG/5ML syrup Take 5 mLs by mouth 3 (three) times daily as needed for cough. 05/14/16   Triplett, Rulon Eisenmengerari B, FNP  ibuprofen (ADVIL,MOTRIN) 600 MG tablet Take 1 tablet (600 mg total) by mouth every 6 (six) hours as needed. 11/06/16   Enid DerryWagner, Demarius Archila, PA-C  lidocaine (XYLOCAINE) 2 % solution Use as directed 10 mLs in the  mouth or throat as needed for mouth pain. 08/30/16   Enid DerryWagner, Rayshon Albaugh, PA-C  ondansetron (ZOFRAN ODT) 4 MG disintegrating tablet Take 1 tablet (4 mg total) by mouth every 8 (eight) hours as needed for nausea or vomiting. 09/05/17   Enid DerryWagner, Bernadene Garside, PA-C  promethazine (PHENERGAN) 12.5 MG tablet Take 1 tablet (12.5 mg total) by mouth every 6 (six) hours as needed for nausea or vomiting. 05/14/16   Triplett, Cari B, FNP  triamcinolone (KENALOG) 0.025 % ointment Apply 1 application topically 2 (two) times daily. 03/30/17   Enid DerryWagner, Tene Gato, PA-C    Allergies Patient has no known allergies.  No family history on file.  Social History Social History   Tobacco Use  . Smoking status: Current Every Day Smoker  . Smokeless tobacco: Never Used  Substance Use Topics  . Alcohol use: Yes  . Drug use: Not on file     Review of Systems  Eyes: No visual changes. No discharge. ENT: Negative for congestion and rhinorrhea. Cardiovascular: No chest pain. Respiratory: Negative for cough. No SOB. Gastrointestinal: No abdominal pain. No constipation. Musculoskeletal: Negative for musculoskeletal pain. Skin: Negative for rash, abrasions, lacerations, ecchymosis. Neurological: Negative for headaches.   ____________________________________________   PHYSICAL EXAM:  VITAL SIGNS: ED Triage Vitals [09/05/17 1153]  Enc Vitals Group     BP 136/74     Pulse Rate 94     Resp 18     Temp 98.5 F (36.9 C)     Temp Source Oral     SpO2 97 %  Weight 135 lb (61.2 kg)     Height 5\' 6"  (1.676 m)     Head Circumference      Peak Flow      Pain Score 0     Pain Loc      Pain Edu?      Excl. in GC?      Constitutional: Alert and oriented. Well appearing and in no acute distress. Eyes: Conjunctivae are normal. PERRL. EOMI. No discharge. Head: Atraumatic. ENT: No frontal and maxillary sinus tenderness.      Ears: Tympanic membranes pearly gray with good landmarks. No discharge.      Nose: No  congestion/rhinnorhea.      Mouth/Throat: Mucous membranes are moist. Oropharynx non-erythematous. Tonsils not enlarged. No exudates. Uvula midline. Neck: No stridor.   Hematological/Lymphatic/Immunilogical: No cervical lymphadenopathy. Cardiovascular: Normal rate, regular rhythm.  Good peripheral circulation. Respiratory: Normal respiratory effort without tachypnea or retractions. Lungs CTAB. Good air entry to the bases with no decreased or absent breath sounds. Gastrointestinal: Bowel sounds 4 quadrants. Soft and nontender to palpation. No guarding or rigidity. No palpable masses. No distention. Musculoskeletal: Full range of motion to all extremities. No gross deformities appreciated. Neurologic:  Normal speech and language. No gross focal neurologic deficits are appreciated.  Skin:  Skin is warm, dry and intact. No rash noted. Psychiatric: Mood and affect are normal. Speech and behavior are normal. Patient exhibits appropriate insight and judgement.   ____________________________________________   LABS (all labs ordered are listed, but only abnormal results are displayed)  Labs Reviewed - No data to display ____________________________________________  EKG   ____________________________________________  RADIOLOGY   No results found.  ____________________________________________    PROCEDURES  Procedure(s) performed:    Procedures    Medications - No data to display   ____________________________________________   INITIAL IMPRESSION / ASSESSMENT AND PLAN / ED COURSE  Pertinent labs & imaging results that were available during my care of the patient were reviewed by me and considered in my medical decision making (see chart for details).  Review of the [place] CSRS was performed in accordance of the [facility] prior to dispensing any controlled drugs.     Patient's diagnosis is consistent with viral gastroenteritis. Vital signs and exam are reassuring.  Patient  feels comfortable going home. Patient will be discharged home with prescriptions for zofran. Patient is to follow up with PCP as needed or otherwise directed. Patient is given ED precautions to return to the ED for any worsening or new symptoms.     ____________________________________________  FINAL CLINICAL IMPRESSION(S) / ED DIAGNOSES  Final diagnoses:  Gastroenteritis      NEW MEDICATIONS STARTED DURING THIS VISIT:  ED Discharge Orders        Ordered    ondansetron (ZOFRAN ODT) 4 MG disintegrating tablet  Every 8 hours PRN     09/05/17 1311          This chart was dictated using voice recognition software/Dragon. Despite best efforts to proofread, errors can occur which can change the meaning. Any change was purely unintentional.    Enid Derry, PA-C 09/05/17 1413    Phineas Semen, MD 09/05/17 [phone]

## 2017-09-05 NOTE — ED Notes (Signed)
Pt states daughter dx with GI virus on Wednesday. Pt states started having vomiting and diarrhea. Reports 1 episode of diarrhea in 24 hrs that was last night, reports 1 episode of vomiting in 24 hrs that was yesterday. Pt reports being able to eat without difficulty. Pt is A&O x4, NAD noted, ambulatory without difficulty. Pt denies blood in vomiting or stools, denies abdominal pain.

## 2017-09-05 NOTE — ED Triage Notes (Signed)
Patient presents to the ED with nausea, vomiting and diarrhea yesterday morning.  Patient states his daughter had a stomach virus on Wednesday.  Patient states he was able to eat dinner last night without throwing up.  Patient states he hasn't vomited since yesterday.  Denies any diarrhea today.  Patient came to the ED last night but left prior to being seen.  Patient denies abdominal pain.

## 2017-09-05 NOTE — ED Notes (Signed)
NAD noted at time of D/C. Pt denies questions or concerns. Pt ambulatory to the lobby at this time.  

## 2017-09-18 ENCOUNTER — Other Ambulatory Visit: Payer: Self-pay

## 2017-09-18 ENCOUNTER — Emergency Department
Admission: EM | Admit: 2017-09-18 | Discharge: 2017-09-18 | Disposition: A | Discharge status: Home / Self Care | Payer: Self-pay | Attending: Emergency Medicine | Admitting: Emergency Medicine

## 2017-09-18 DIAGNOSIS — J45909 Unspecified asthma, uncomplicated: Secondary | ICD-10-CM | POA: Insufficient documentation

## 2017-09-18 DIAGNOSIS — Z79899 Other long term (current) drug therapy: Secondary | ICD-10-CM | POA: Insufficient documentation

## 2017-09-18 DIAGNOSIS — H1032 Unspecified acute conjunctivitis, left eye: Secondary | ICD-10-CM | POA: Insufficient documentation

## 2017-09-18 DIAGNOSIS — F17200 Nicotine dependence, unspecified, uncomplicated: Secondary | ICD-10-CM | POA: Insufficient documentation

## 2017-09-18 MED ORDER — POLYMYXIN B-TRIMETHOPRIM 10000-0.1 UNIT/ML-% OP SOLN: 1.0000 [drp] | OPHTHALMIC | 0 refills | Status: AC

## 2017-09-18 NOTE — ED Triage Notes (Signed)
Patient to ED for left eye pain. States the corner of his eye is painful to touch. Thinks he has pink eye. Conjunctiva is normal color, no redness or swelling noted. Denies injury.

## 2017-09-18 NOTE — ED Provider Notes (Signed)
[facility] Emergency Department Provider Note  ____________________________________________  Time seen: Approximately 11:49 PM  I have reviewed the triage vital signs and the nursing notes.   HISTORY  Chief Complaint Eye Pain    HPI Jeremy Chang is a 22 y.o. male presents to the emergency department with perceived left eye irritation for the past 2 days.  Patient reports that he "has pinkeye".  Patient reports that he was unable to go to work today and yesterday due to eye irritation.  He denies photophobia and increased tearing.  Patient reports crusting at the corners of his eyes.  Patient is not having difficulty keeping left eye open.  No alleviating measures have been attempted.    Past Medical History:  Diagnosis Date  . Asthma     There are no active problems to display for this patient.   History reviewed. No pertinent surgical history.  Prior to Admission medications   Medication Sig Start Date End Date Taking? Authorizing Provider  albuterol (PROVENTIL HFA;VENTOLIN HFA) 108 (90 Base) MCG/ACT inhaler Inhale 2 puffs into the lungs every 6 (six) hours as needed for wheezing or shortness of breath. 08/30/16   Enid Derry, PA-C  brompheniramine-pseudoephedrine-DM 30-2-10 MG/5ML syrup Take 10 mLs by mouth 3 (three) times daily as needed. 07/09/17   Triplett, Cari B, FNP  fluticasone (FLONASE) 50 MCG/ACT nasal spray Place 2 sprays into both nostrils daily. 03/30/17 03/30/18  Enid Derry, PA-C  guaiFENesin-codeine (ROBITUSSIN AC) 100-10 MG/5ML syrup Take 5 mLs by mouth 3 (three) times daily as needed for cough. 05/14/16   Triplett, Rulon Eisenmenger B, FNP  ibuprofen (ADVIL,MOTRIN) 600 MG tablet Take 1 tablet (600 mg total) by mouth every 6 (six) hours as needed. 11/06/16   Enid Derry, PA-C  lidocaine (XYLOCAINE) 2 % solution Use as directed 10 mLs in the mouth or throat as needed for mouth pain. 08/30/16   Enid Derry, PA-C  ondansetron (ZOFRAN ODT) 4 MG  disintegrating tablet Take 1 tablet (4 mg total) by mouth every 8 (eight) hours as needed for nausea or vomiting. 09/05/17   Enid Derry, PA-C  promethazine (PHENERGAN) 12.5 MG tablet Take 1 tablet (12.5 mg total) by mouth every 6 (six) hours as needed for nausea or vomiting. 05/14/16   Triplett, Cari B, FNP  triamcinolone (KENALOG) 0.025 % ointment Apply 1 application topically 2 (two) times daily. 03/30/17   Enid Derry, PA-C  trimethoprim-polymyxin b (POLYTRIM) ophthalmic solution Place 1 drop into the left eye every 4 (four) hours for 7 days. 09/18/17 09/25/17  Orvil Feil, PA-C    Allergies Patient has no known allergies.  No family history on file.  Social History Social History   Tobacco Use  . Smoking status: Current Every Day Smoker  . Smokeless tobacco: Never Used  Substance Use Topics  . Alcohol use: Yes  . Drug use: Not on file     Review of Systems  Constitutional: No fever/chills Eyes: No visual changes. No discharge ENT: Patient has left eye irritation. Cardiovascular: no chest pain. Respiratory: no cough. No SOB. Gastrointestinal: No abdominal pain.  No nausea, no vomiting.  No diarrhea.  No constipation. Musculoskeletal: Negative for musculoskeletal pain. Skin: Negative for rash, abrasions, lacerations, ecchymosis. Neurological: Negative for headaches, focal weakness or numbness.   ____________________________________________   PHYSICAL EXAM:  VITAL SIGNS: ED Triage Vitals  Enc Vitals Group     BP 09/18/17 2047 (!) 152/78     Pulse Rate 09/18/17 2047 85  Resp 09/18/17 2047 18     Temp 09/18/17 2047 99.1 F (37.3 C)     Temp Source 09/18/17 2047 Oral     SpO2 09/18/17 2047 99 %     Weight 09/18/17 2048 135 lb (61.2 kg)     Height 09/18/17 2048 5\' 6"  (1.676 m)     Head Circumference --      Peak Flow --      Pain Score 09/18/17 2048 1     Pain Loc --      Pain Edu? --      Excl. in GC? --      Constitutional: Alert and oriented. Well  appearing and in no acute distress. Eyes: Conjunctivae are normal. PERRL. EOMI. Head: Atraumatic. ENT:      Ears: TMs are pearly.      Nose: No congestion/rhinnorhea.      Mouth/Throat: Mucous membranes are moist.  Neck: No stridor.  No cervical spine tenderness to palpation. Cardiovascular: Normal rate, regular rhythm. Normal S1 and S2.  Good peripheral circulation. Respiratory: Normal respiratory effort without tachypnea or retractions. Lungs CTAB. Good air entry to the bases with no decreased or absent breath sounds. Skin:  Skin is warm, dry and intact. No rash noted.  ____________________________________________   LABS (all labs ordered are listed, but only abnormal results are displayed)  Labs Reviewed - No data to display ____________________________________________  EKG   ____________________________________________  RADIOLOGY   No results found.  ____________________________________________    PROCEDURES  Procedure(s) performed:    Procedures    Medications - No data to display   ____________________________________________   INITIAL IMPRESSION / ASSESSMENT AND PLAN / ED COURSE  Pertinent labs & imaging results that were available during my care of the patient were reviewed by me and considered in my medical decision making (see chart for details).  Review of the [place] CSRS was performed in accordance of the [facility] prior to dispensing any controlled drugs.      Assessment and plan Conjunctivitis Patient presents to the emergency department with perceived left eye irritation and crusting.  Overall physical exam was completely reassuring.  Patient was discharged with Polytrim ophthalmic solution and advised to follow-up with primary care as needed.  All patient questions were answered.    ____________________________________________  FINAL CLINICAL IMPRESSION(S) / ED DIAGNOSES  Final diagnoses:  Acute conjunctivitis of left eye, unspecified acute  conjunctivitis type      NEW MEDICATIONS STARTED DURING THIS VISIT:  ED Discharge Orders        Ordered    trimethoprim-polymyxin b (POLYTRIM) ophthalmic solution  Every 4 hours     09/18/17 2318          This chart was dictated using voice recognition software/Dragon. Despite best efforts to proofread, errors can occur which can change the meaning. Any change was purely unintentional.    Orvil FeilWoods, Morrigan Wickens M, PA-C 09/18/17 2352    Phineas SemenGoodman, Graydon, MD 09/19/17 1504

## 2017-10-17 ENCOUNTER — Encounter: Payer: Self-pay | Admitting: Emergency Medicine

## 2017-10-17 ENCOUNTER — Emergency Department: Payer: Self-pay

## 2017-10-17 ENCOUNTER — Emergency Department
Admission: EM | Admit: 2017-10-17 | Discharge: 2017-10-17 | Disposition: A | Discharge status: Home / Self Care | Payer: Self-pay | Attending: Emergency Medicine | Admitting: Emergency Medicine

## 2017-10-17 ENCOUNTER — Other Ambulatory Visit: Payer: Self-pay

## 2017-10-17 DIAGNOSIS — F17210 Nicotine dependence, cigarettes, uncomplicated: Secondary | ICD-10-CM | POA: Insufficient documentation

## 2017-10-17 DIAGNOSIS — J069 Acute upper respiratory infection, unspecified: Secondary | ICD-10-CM | POA: Insufficient documentation

## 2017-10-17 DIAGNOSIS — J45909 Unspecified asthma, uncomplicated: Secondary | ICD-10-CM | POA: Insufficient documentation

## 2017-10-17 MED ORDER — AZITHROMYCIN 250 MG PO TABS: ORAL_TABLET | ORAL | 0 refills | Status: DC

## 2017-10-17 NOTE — ED Provider Notes (Signed)
[facility] Emergency Department Provider Note ____________________________________________  Time seen: 2200  I have reviewed the triage vital signs and the nursing notes.  HISTORY  Chief Complaint  URI   HPI Jeremy Chang is a 22 y.o. male presents to the ER today with complaint of runny nose, nasal congestion, cough and shortness of breath.  He reports this started 3 days ago.  He is not blowing anything out of his nose.  The cough is productive of yellow mucus.  This shortness of breath is worse with exertion, but he denies chest pain.  He reports low-grade fevers of 99 degrees, chills and body aches.  He has tried cold and flu medicine over-the-counter with minimal relief.  He has a history of allergies and asthma but currently not taking any antihistamines or inhalers.  He has had sick contacts.  He does smoke.  Past Medical History:  Diagnosis Date  . Asthma     There are no active problems to display for this patient.   History reviewed. No pertinent surgical history.  Prior to Admission medications   Medication Sig Start Date End Date Taking? Authorizing Provider  ibuprofen (ADVIL,MOTRIN) 600 MG tablet Take 1 tablet (600 mg total) by mouth every 6 (six) hours as needed. 11/06/16  Yes Enid DerryWagner, Ashley, PA-C  azithromycin (ZITHROMAX) 250 MG tablet Take 2 tabs today, then 1 tab daily x 4 days 10/17/17   Lorre MunroeBaity, Sufyan Meidinger W, NP    Allergies Patient has no known allergies.  History reviewed. No pertinent family history.  Social History Social History   Tobacco Use  . Smoking status: Current Every Day Smoker    Packs/day: 0.25    Types: Cigarettes  . Smokeless tobacco: Never Used  Substance Use Topics  . Alcohol use: Not Currently  . Drug use: Yes    Types: Marijuana    Comment: last smoked 2 days ago    Review of Systems  Constitutional: Positive for fever. ENT: Positive for runny nose, nasal congestion.  Negative for ear pain, sore  throat. Cardiovascular: Negative for chest pain. Respiratory: Positive for cough and shortness of breath. ____________________________________________  PHYSICAL EXAM:  VITAL SIGNS: ED Triage Vitals  Enc Vitals Group     BP 10/17/17 2123 138/88     Pulse Rate 10/17/17 2123 (!) 109     Resp 10/17/17 2123 17     Temp 10/17/17 2123 99 F (37.2 C)     Temp Source 10/17/17 2123 Oral     SpO2 10/17/17 2123 99 %     Weight 10/17/17 2124 135 lb (61.2 kg)     Height 10/17/17 2124 5\' 6"  (1.676 m)     Head Circumference --      Peak Flow --      Pain Score 10/17/17 2123 1     Pain Loc --      Pain Edu? --      Excl. in GC? --     Constitutional: Alert and oriented. Well appearing and in no distress. Head: Normocephalic and atraumatic. Ears: Canals clear. TMs intact bilaterally. Nose: No congestion/rhinorrhea/epistaxis. Mouth/Throat: Mucous membranes are moist.  No tonsillar enlargement or exudate noted. Hematological/Lymphatic/Immunological: No cervical lymphadenopathy. Cardiovascular: Tachycardic, regular rhythm. Normal distal pulses. Respiratory: Normal respiratory effort.  Diminished breath sounds.  No wheezes/rales/rhonchi. Neurologic:   Normal speech and language.  ____________________________________________   RADIOLOGY  Imaging Orders     DG Chest 2 View IMPRESSION: No active cardiopulmonary disease ___________________________________________    INITIAL  IMPRESSION / ASSESSMENT AND PLAN / ED COURSE  Upper Respiratory Infection:  Chest xray negative Likely viral at this point Take Mucinex 600 mg every 12 hours x 3 days RX for Azithromycin to start taking on Wednesday, if symptoms persist or worsen Delsym as needed for cough  ____________________________________________  FINAL CLINICAL IMPRESSION(S) / ED DIAGNOSES  Final diagnoses:  Viral upper respiratory tract infection      Lorre Munroe, NP 10/17/17 2238    Dionne Bucy, MD 10/17/17  2322

## 2017-10-17 NOTE — ED Notes (Signed)
Pt reports cough and congestion to the point of discomfort, production of yellow mucus,  and fever at home 99 degrees, nose congestion, marijuana and tobacco smoke (.33 ppd) daily, pt reports lack of energy as well

## 2017-10-17 NOTE — ED Notes (Signed)
Patient transported to X-ray 

## 2017-10-17 NOTE — ED Triage Notes (Addendum)
Pt reports cold symptoms since Thursday night; nonproductive cough, hot/cold chills (afebrile), sinus drainage alternating with sinus congestion; pt talking in complete coherent sentences; lungs clear in triage

## 2017-10-17 NOTE — Discharge Instructions (Signed)
You have been diagnosed with a viral upper respiratory infection. Try Mucinex 600 mg every 12 hours for 3 days You have been given a RX for Azithromax x 6 days to start taking on Tuesday if you seem worse Delsym as needed for cough

## 2017-12-16 ENCOUNTER — Emergency Department
Admission: EM | Admit: 2017-12-16 | Discharge: 2017-12-16 | Disposition: A | Discharge status: Home / Self Care | Payer: Self-pay | Attending: Emergency Medicine | Admitting: Emergency Medicine

## 2017-12-16 ENCOUNTER — Encounter: Payer: Self-pay | Admitting: Emergency Medicine

## 2017-12-16 ENCOUNTER — Other Ambulatory Visit: Payer: Self-pay

## 2017-12-16 DIAGNOSIS — Y939 Activity, unspecified: Secondary | ICD-10-CM | POA: Insufficient documentation

## 2017-12-16 DIAGNOSIS — Y929 Unspecified place or not applicable: Secondary | ICD-10-CM | POA: Insufficient documentation

## 2017-12-16 DIAGNOSIS — F17210 Nicotine dependence, cigarettes, uncomplicated: Secondary | ICD-10-CM | POA: Insufficient documentation

## 2017-12-16 DIAGNOSIS — Y999 Unspecified external cause status: Secondary | ICD-10-CM | POA: Insufficient documentation

## 2017-12-16 DIAGNOSIS — S61012A Laceration without foreign body of left thumb without damage to nail, initial encounter: Secondary | ICD-10-CM | POA: Insufficient documentation

## 2017-12-16 DIAGNOSIS — W260XXA Contact with knife, initial encounter: Secondary | ICD-10-CM | POA: Insufficient documentation

## 2017-12-16 DIAGNOSIS — J45909 Unspecified asthma, uncomplicated: Secondary | ICD-10-CM | POA: Insufficient documentation

## 2017-12-16 NOTE — ED Notes (Signed)
Wound cleaned with ns and bandaid applied.

## 2017-12-16 NOTE — ED Triage Notes (Signed)
Pt comes into the ED via POV c/o laceration to the tip of the left thumb.  Patient states he cut it last night and didn't feel comfortable going to work with the open wound.  Patient in NAD at this time with even and unlabored respirations.

## 2017-12-16 NOTE — ED Notes (Signed)
See triage note  Presents with laceration to left thumb  States he was cutting something last pm    Avulsion laceration noted to tip of thumb  Bleeding controlled at present

## 2017-12-16 NOTE — ED Provider Notes (Signed)
[facility] Emergency Department Provider Note  ____________________________________________   First MD Initiated Contact with Patient 12/16/17 1337     (approximate)  I have reviewed the triage vital signs and the nursing notes.   HISTORY  Chief Complaint Laceration   HPI Jeremy Chang is a 22 y.o. male presents to the ED with complaint of laceration to his left thumb that occurred last evening on a knife.  Patient states that he is up-to-date on his tetanus.  He states that he did not go to work today as he felt uncomfortable with an open wound and that he works in [employer].  Rates pain as 3/10.   Past Medical History:  Diagnosis Date  . Asthma     There are no active problems to display for this patient.   History reviewed. No pertinent surgical history.  Prior to Admission medications   Not on File    Allergies Patient has no known allergies.  No family history on file.  Social History Social History   Tobacco Use  . Smoking status: Current Every Day Smoker    Packs/day: 0.25    Types: Cigarettes  . Smokeless tobacco: Never Used  Substance Use Topics  . Alcohol use: Not Currently  . Drug use: Yes    Types: Marijuana    Comment: last smoked 2 days ago    Review of Systems Constitutional: No fever/chills Cardiovascular: Denies chest pain. Respiratory: Denies shortness of breath. Musculoskeletal: Positive left thumb pain. Skin: Positive laceration left thumb. Neurological: Negative for headaches, focal weakness or numbness. ___________________________________________   PHYSICAL EXAM:  VITAL SIGNS: ED Triage Vitals  Enc Vitals Group     BP 12/16/17 1232 115/65     Pulse Rate 12/16/17 1232 70     Resp 12/16/17 1232 15     Temp 12/16/17 1232 97.6 F (36.4 C)     Temp Source 12/16/17 1232 Oral     SpO2 12/16/17 1232 100 %     Weight 12/16/17 1229 140 lb (63.5 kg)     Height 12/16/17 1229 5\' 6"  (1.676 m)    Head Circumference --      Peak Flow --      Pain Score 12/16/17 1229 3     Pain Loc --      Pain Edu? --      Excl. in GC? --    Constitutional: Alert and oriented. Well appearing and in no acute distress. Eyes: Conjunctivae are normal.  Head: Atraumatic. Neck: No stridor.   Cardiovascular: Normal rate, regular rhythm. Grossly normal heart sounds.  Good peripheral circulation. Respiratory: Normal respiratory effort.  No retractions. Lungs CTAB. Musculoskeletal: On examination of left thumb there is no restriction with range of motion. Neurologic:  Normal speech and language. No gross focal neurologic deficits are appreciated.  Skin:  Skin is warm.  On examination of left thumb there is a very superficial 0.2 cm skin avulsion with no active bleeding.  No foreign body is noted. Psychiatric: Mood and affect are normal. Speech and behavior are normal.  ____________________________________________   LABS (all labs ordered are listed, but only abnormal results are displayed)  Labs Reviewed - No data to display  PROCEDURES  Procedure(s) performed: None  Procedures  Critical Care performed: No  ____________________________________________   INITIAL IMPRESSION / ASSESSMENT AND PLAN / ED COURSE  As part of my medical decision making, I reviewed the following data within the electronic MEDICAL RECORD NUMBER Notes from prior  ED visits and [place] Controlled Substance Database  Patient presents with a laceration that occurred last evening and did not go to work today as he works in [employer].  Patient is up-to-date on tetanus.  He was instructed to keep area clean and dry and to clean daily with mild soap and water.  He is instructed to watch for any signs of infection.  Normal saline was used to clean the area and he was placed in a Band-Aid.  ____________________________________________   FINAL CLINICAL IMPRESSION(S) / ED DIAGNOSES  Final diagnoses:  Laceration of left thumb without  foreign body without damage to nail, initial encounter     ED Discharge Orders    None       Note:  This document was prepared using Dragon voice recognition software and may include unintentional dictation errors.    Tommi RumpsSummers, Almetta Liddicoat L, PA-C 12/16/17 1523    Phineas SemenGoodman, Graydon, MD 12/17/17 [phone]

## 2017-12-16 NOTE — Discharge Instructions (Signed)
Clean area with mild soap and water daily.  Cover thumb while at work.  Allowed to get air when not at work and sitting at home.  Watch for any signs of infection.  You may take Tylenol or ibuprofen as needed for pain.

## 2018-05-31 ENCOUNTER — Encounter: Payer: Self-pay | Admitting: Emergency Medicine

## 2018-05-31 ENCOUNTER — Emergency Department: Payer: Self-pay

## 2018-05-31 ENCOUNTER — Other Ambulatory Visit: Payer: Self-pay

## 2018-05-31 ENCOUNTER — Emergency Department
Admission: EM | Admit: 2018-05-31 | Discharge: 2018-05-31 | Disposition: A | Discharge status: Home / Self Care | Payer: Self-pay | Attending: Emergency Medicine | Admitting: Emergency Medicine

## 2018-05-31 DIAGNOSIS — R05 Cough: Secondary | ICD-10-CM | POA: Insufficient documentation

## 2018-05-31 DIAGNOSIS — Z5321 Procedure and treatment not carried out due to patient leaving prior to being seen by health care provider: Secondary | ICD-10-CM | POA: Insufficient documentation

## 2018-05-31 DIAGNOSIS — R112 Nausea with vomiting, unspecified: Secondary | ICD-10-CM | POA: Insufficient documentation

## 2018-05-31 DIAGNOSIS — M7918 Myalgia, other site: Secondary | ICD-10-CM | POA: Insufficient documentation

## 2018-05-31 DIAGNOSIS — R197 Diarrhea, unspecified: Secondary | ICD-10-CM | POA: Insufficient documentation

## 2018-05-31 LAB — URINALYSIS, COMPLETE (UACMP) WITH MICROSCOPIC
Bacteria, UA: NONE SEEN
Bilirubin Urine: NEGATIVE
GLUCOSE, UA: NEGATIVE mg/dL
HGB URINE DIPSTICK: NEGATIVE
Ketones, ur: NEGATIVE mg/dL
Leukocytes,Ua: NEGATIVE
Nitrite: NEGATIVE
Protein, ur: NEGATIVE mg/dL
SPECIFIC GRAVITY, URINE: 1.024 (ref 1.005–1.030)
SQUAMOUS EPITHELIAL / LPF: NONE SEEN (ref 0–5)
pH: 5 (ref 5.0–8.0)

## 2018-05-31 NOTE — ED Notes (Signed)
No answer when called several times from lobby 

## 2018-05-31 NOTE — ED Notes (Signed)
Pt refusing blood draw at this time. States he has extreme fear of needles.

## 2018-05-31 NOTE — ED Triage Notes (Signed)
Pt presents to ED c/o n/v/d since Monday with body aches. Pt has mild cough but states it feels like his chronic smoker's cough.

## 2018-06-02 ENCOUNTER — Telehealth: Payer: Self-pay | Admitting: Emergency Medicine

## 2018-06-02 NOTE — Telephone Encounter (Signed)
Called patient due to lwot to inquire about condition and follow up plans. Says he had to leave due to an emergency for one of his children.  He says he is fine now.

## 2018-09-29 IMAGING — CR DG CHEST 2V
1 series · 2 of 2 positions shown · non-contrast
Comparison: 03/30/2016

CLINICAL DATA: Pt reports cold symptoms since [REDACTED] night;
nonproductive cough, hot/cold chills (afebrile), sinus drainage
alternating with sinus congestion

EXAM:
CHEST - 2 VIEW

[Series 1: dg chest 2 view · 0.14mm/px · 2 of 2 slices shown]
[im 1/2]
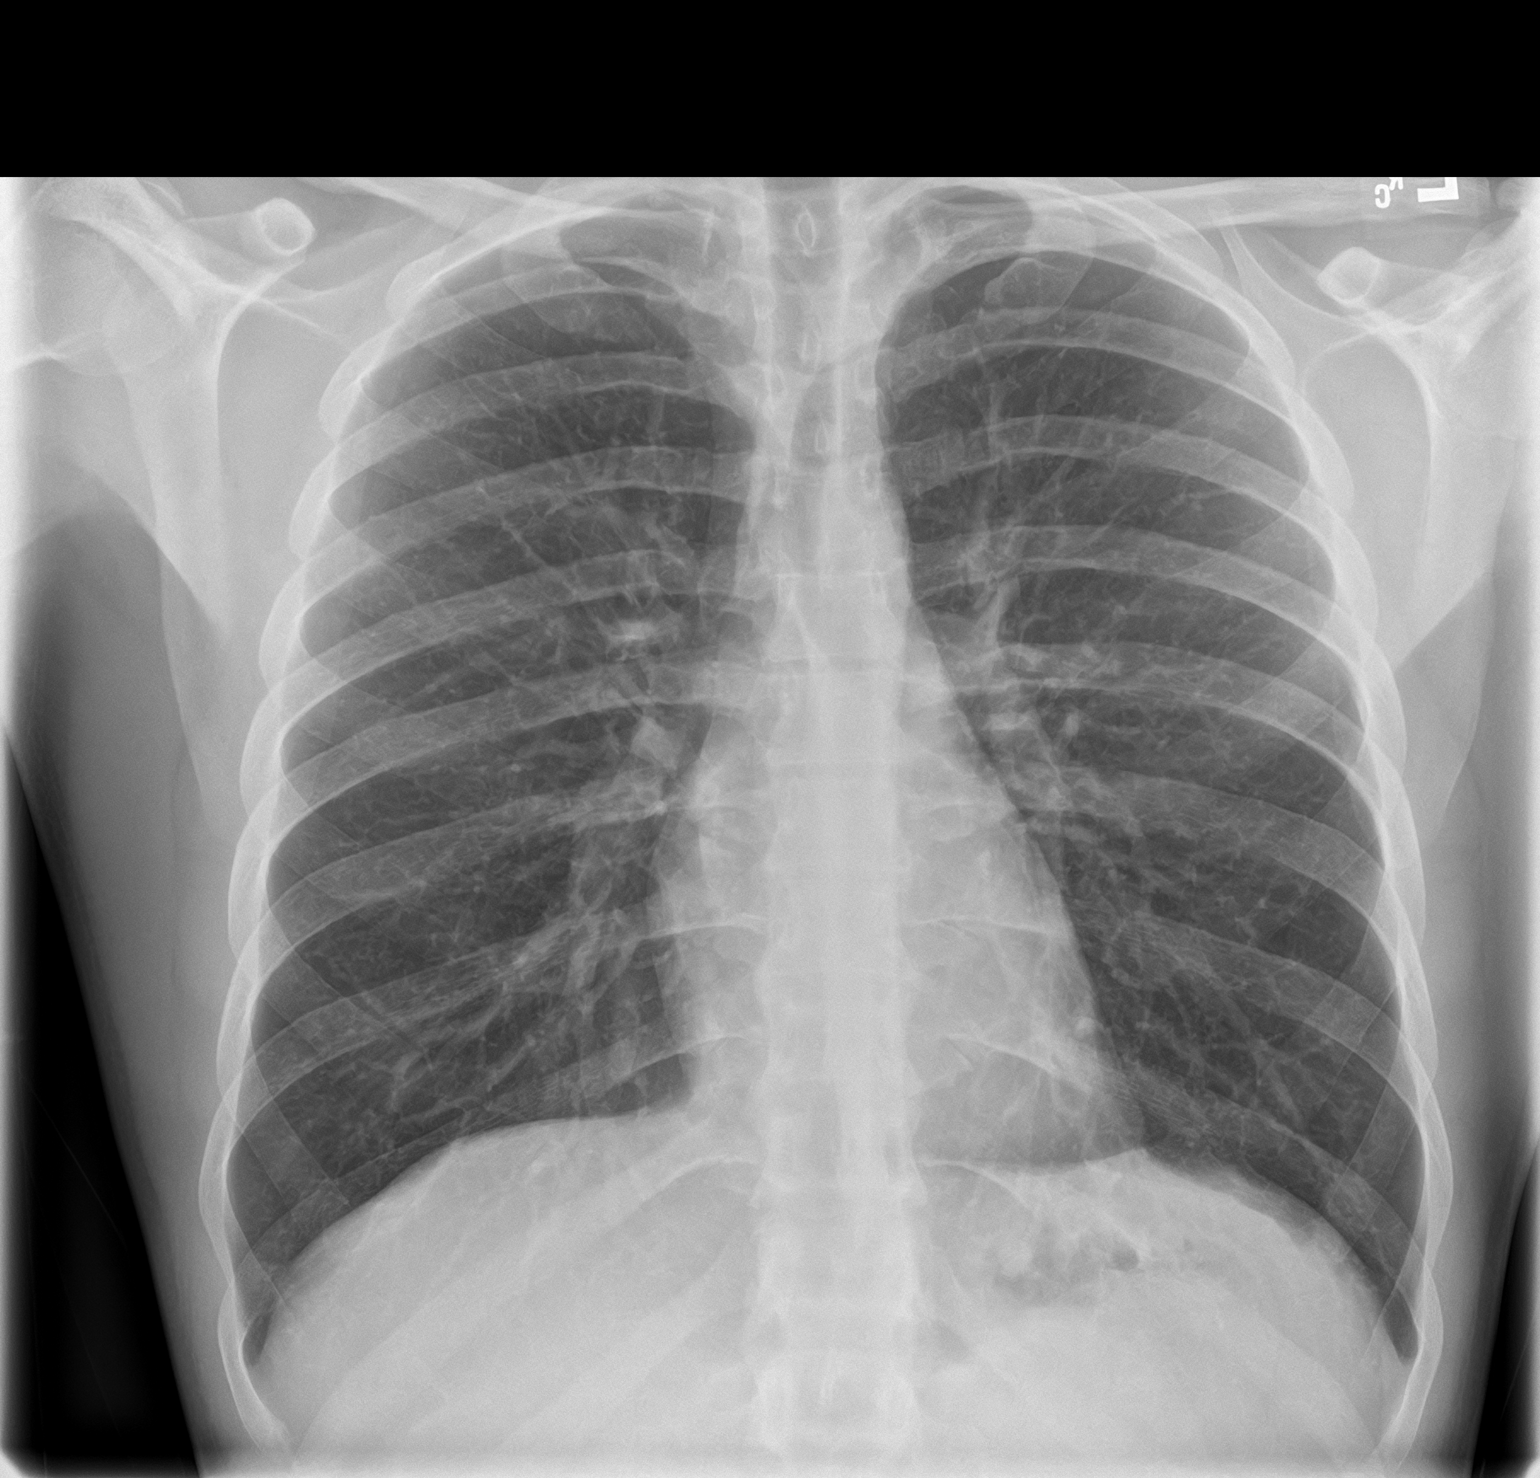
[im 2/2]
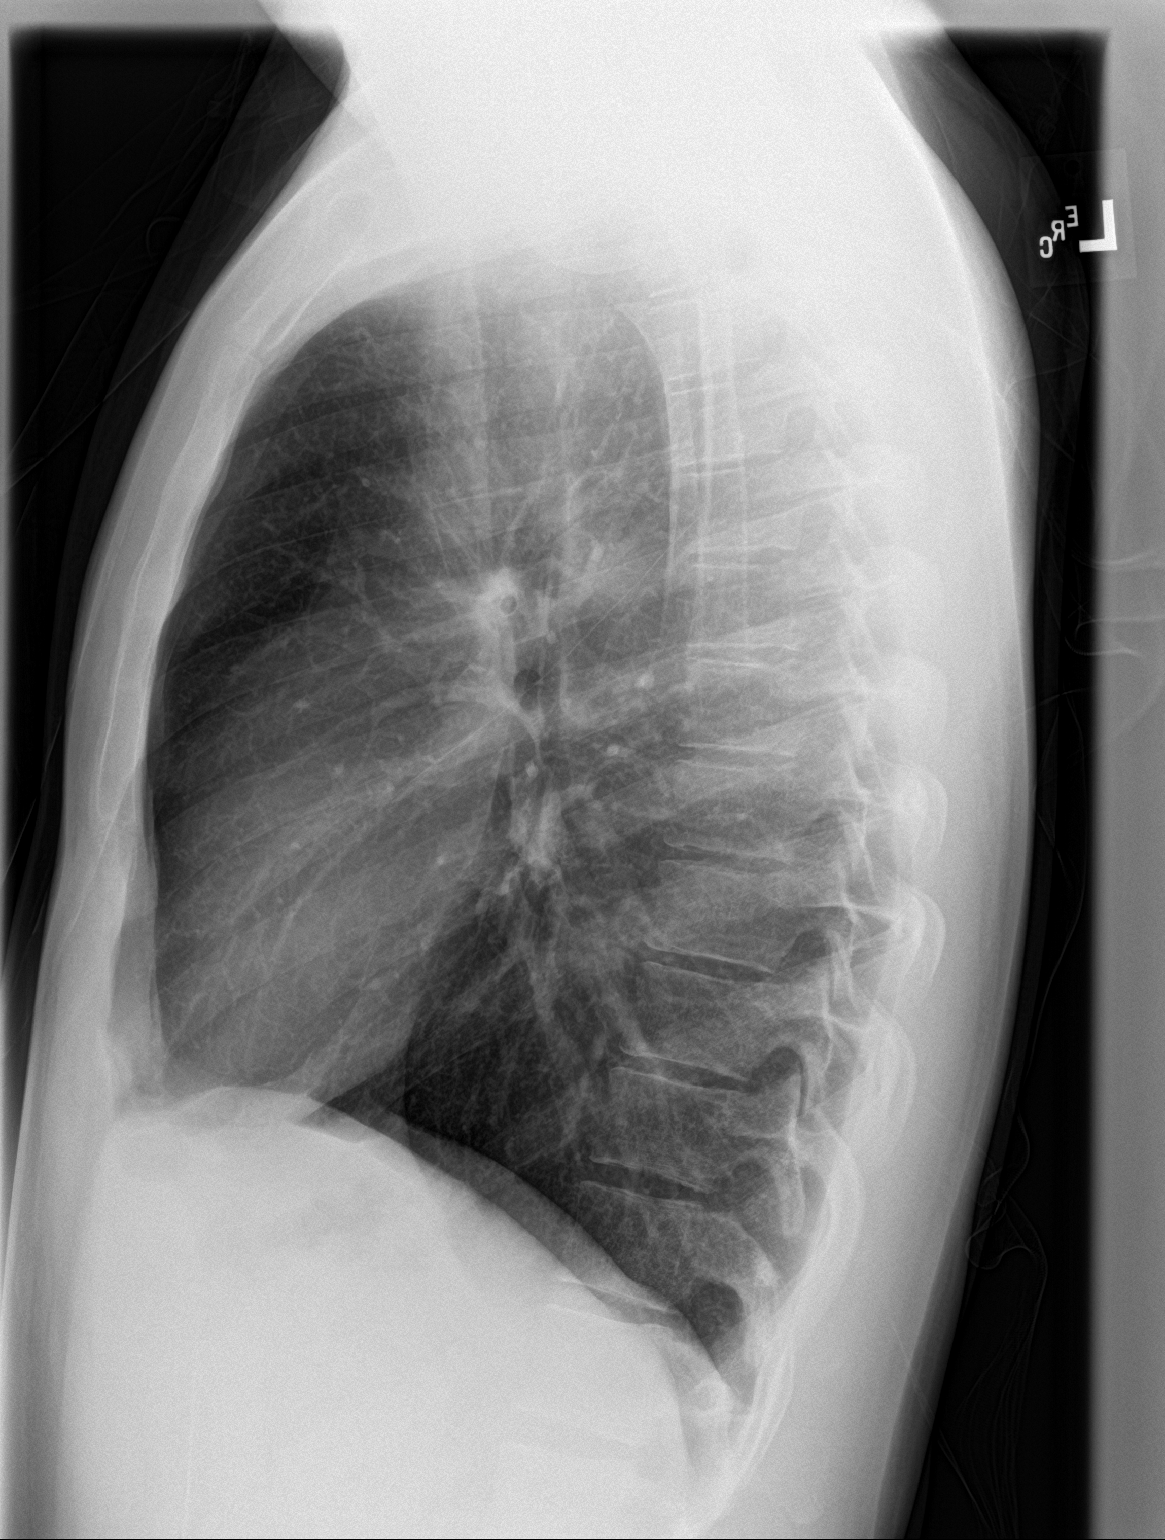

[2 of 2 positions shown; findings below may reference images not displayed]

FINDINGS: Mild hyperinflation. Normal heart size and pulmonary vascularity. No
focal airspace disease or consolidation in the lungs. No blunting of
costophrenic angles. No pneumothorax. Mediastinal contours appear
intact.
IMPRESSION: No active cardiopulmonary disease.

## 2019-09-11 ENCOUNTER — Encounter: Payer: Self-pay | Admitting: Emergency Medicine

## 2019-09-11 ENCOUNTER — Ambulatory Visit
Admission: EM | Admit: 2019-09-11 | Discharge: 2019-09-11 | Disposition: A | Discharge status: Home / Self Care | Payer: Self-pay | Attending: Family Medicine | Admitting: Family Medicine

## 2019-09-11 ENCOUNTER — Other Ambulatory Visit: Payer: Self-pay

## 2019-09-11 DIAGNOSIS — R21 Rash and other nonspecific skin eruption: Secondary | ICD-10-CM

## 2019-09-11 DIAGNOSIS — B86 Scabies: Secondary | ICD-10-CM

## 2019-09-11 MED ORDER — PREDNISONE 10 MG PO TABS: ORAL_TABLET | ORAL | 0 refills | Status: DC

## 2019-09-11 MED ORDER — PERMETHRIN 5 % EX CREA: 1.0000 "application " | TOPICAL_CREAM | Freq: Once | CUTANEOUS | 1 refills | Status: AC

## 2019-09-11 NOTE — ED Triage Notes (Signed)
Patient c/o rash that started 2 days ago. He states the rash itches. The rash is located on both arms and his abdomen and chest area.

## 2019-09-11 NOTE — ED Provider Notes (Signed)
[facility] ____________________________________________  Time seen: Approximately 6:58 PM  I have reviewed the triage vital signs and the nursing notes.   HISTORY  Chief Complaint Rash  HPI Jeremy Chang is a 24 y.o. male presenting for evaluation of itchy rash.  Reports rash is present to bilateral arms as well as torso.  Denies known trigger.  Reports that he does travel a lot in and out of hotels.  States that his girlfriend is starting to have some of the similar appearance.  Denies known changes in foods, medicines, lotions, detergents or other contacts.  States rash is very itchy, not painful.  Denies fevers or recent sickness.  Has been trying to apply lotion and keeping hydrated without resolution.  Denies other relieving factors.  States otherwise feels well.  Denies known insect bites.    Past Medical History:  Diagnosis Date  . Asthma     There are no problems to display for this patient.   History reviewed. No pertinent surgical history.   No current facility-administered medications for this encounter.  Current Outpatient Medications:  .  permethrin (ELIMITE) 5 % cream, Apply 1 application topically once for 1 dose. Apply to entire body topically once and leave on for 8-14 hours then wash off. Repeat in 1 week. Rx: 2 doses, Disp: 60 g, Rfl: 1 .  predniSONE (DELTASONE) 10 MG tablet, Start 60 mg po day one, then 50 mg po day two, taper by 10 mg daily until complete., Disp: 21 tablet, Rfl: 0  Allergies Patient has no known allergies.  History reviewed. No pertinent family history.  Social History Social History   Tobacco Use  . Smoking status: Current Every Day Smoker    Packs/day: 0.25    Types: Cigarettes  . Smokeless tobacco: Never Used  Substance Use Topics  . Alcohol use: Not Currently  . Drug use: Yes    Types: Marijuana    Review of Systems Constitutional: No fever Cardiovascular: Denies chest pain. Respiratory: Denies  shortness of breath. Musculoskeletal: Negative for back pain. Skin: Positive for rash.   ____________________________________________   PHYSICAL EXAM:  VITAL SIGNS: ED Triage Vitals  Enc Vitals Group     BP 09/11/19 1842 120/71     Pulse Rate 09/11/19 1842 70     Resp 09/11/19 1842 18     Temp 09/11/19 1842 98.5 F (36.9 C)     Temp Source 09/11/19 1842 Oral     SpO2 09/11/19 1842 97 %     Weight 09/11/19 1840 150 lb (68 kg)     Height 09/11/19 1840 5\' 5"  (1.651 m)     Head Circumference --      Peak Flow --      Pain Score 09/11/19 1840 0     Pain Loc --      Pain Edu? --      Excl. in GC? --     Constitutional: Alert and oriented. Well appearing and in no acute distress. Eyes: Conjunctivae are normal.  ENT      Head: Normocephalic and atraumatic. Respiratory: Normal respiratory effort without tachypnea nor retraction. Musculoskeletal:Steady gait.  Neurologic:  Normal speech and language.  Speech is normal. No gait instability.  Skin:  Skin is warm, dry. Except: Bilateral forearms mildly erythematous pruritic excoriated papular rash scattered similar to abdomen, no further surrounding erythema, no other rash noted. Psychiatric: Mood and affect are normal. Speech and behavior are normal. Patient exhibits appropriate insight and judgment   ___________________________________________  LABS (all labs ordered are listed, but only abnormal results are displayed)  Labs Reviewed - No data to display ____________________________________________  PROCEDURES Procedures    INITIAL IMPRESSION / ASSESSMENT AND PLAN / ED COURSE  Pertinent labs & imaging results that were available during my care of the patient were reviewed by me and considered in my medical decision making (see chart for details).  Well-appearing patient.  No acute distress.  Rash clinical appearance consistent with contact dermatitis versus scabies.  Will treat with oral prednisone taper as well as  permethrin cream.  Counseled and washing bedding and partner being treated.  Antihistamines over-the-counter.  Supportive care monitor.Discussed indication, risks and benefits of medications with patient.   Discussed follow up with Primary care physician this week as needed. Discussed follow up and return parameters including no resolution or any worsening concerns. Patient verbalized understanding and agreed to plan.   ____________________________________________   FINAL CLINICAL IMPRESSION(S) / ED DIAGNOSES  Final diagnoses:  Rash  Scabies     ED Discharge Orders         Ordered    predniSONE (DELTASONE) 10 MG tablet     Discontinue  Reprint     09/11/19 1859    permethrin (ELIMITE) 5 % cream   Once     Discontinue  Reprint     09/11/19 1859           Note: This dictation was prepared with Dragon dictation along with smaller phrase technology. Any transcriptional errors that result from this process are unintentional.         Marylene Land, NP 09/11/19 1939

## 2019-09-11 NOTE — Discharge Instructions (Addendum)
Take medication as prescribed. Avoid scratching. Wash all clothing and bedding well.   Follow up with your primary care physician this week as needed. Return to Urgent care for new or worsening concerns.

## 2019-11-23 ENCOUNTER — Ambulatory Visit
Admission: EM | Admit: 2019-11-23 | Discharge: 2019-11-23 | Disposition: A | Discharge status: Home / Self Care | Payer: Self-pay | Attending: Family Medicine | Admitting: Family Medicine

## 2019-11-23 ENCOUNTER — Other Ambulatory Visit: Payer: Self-pay

## 2019-11-23 ENCOUNTER — Encounter: Payer: Self-pay | Admitting: Emergency Medicine

## 2019-11-23 DIAGNOSIS — Z20822 Contact with and (suspected) exposure to covid-19: Secondary | ICD-10-CM | POA: Insufficient documentation

## 2019-11-23 DIAGNOSIS — R197 Diarrhea, unspecified: Secondary | ICD-10-CM

## 2019-11-23 DIAGNOSIS — R112 Nausea with vomiting, unspecified: Secondary | ICD-10-CM

## 2019-11-23 DIAGNOSIS — Z87891 Personal history of nicotine dependence: Secondary | ICD-10-CM | POA: Insufficient documentation

## 2019-11-23 DIAGNOSIS — Z79899 Other long term (current) drug therapy: Secondary | ICD-10-CM | POA: Insufficient documentation

## 2019-11-23 DIAGNOSIS — K529 Noninfective gastroenteritis and colitis, unspecified: Secondary | ICD-10-CM | POA: Insufficient documentation

## 2019-11-23 DIAGNOSIS — J45909 Unspecified asthma, uncomplicated: Secondary | ICD-10-CM | POA: Insufficient documentation

## 2019-11-23 DIAGNOSIS — Z7952 Long term (current) use of systemic steroids: Secondary | ICD-10-CM | POA: Insufficient documentation

## 2019-11-23 MED ORDER — ONDANSETRON HCL 4 MG PO TABS: 4.0000 mg | ORAL_TABLET | Freq: Three times a day (TID) | ORAL | 0 refills | Status: AC | PRN

## 2019-11-23 NOTE — Discharge Instructions (Addendum)
Symptoms are consistent with a viral intestinal infection.  I have advised that you rest, increase fluids, take Zofran for nausea and vomiting and purchase over-the-counter Imodium for diarrhea.  If positive for Covid you have to be out of work for 10 days from symptoms start.  Follow-up for any new or worsening symptoms.

## 2019-11-23 NOTE — ED Provider Notes (Signed)
[facility]    CSN: [id number] Arrival date & time: 11/23/19  1656      History   Chief Complaint Chief Complaint  Patient presents with  . Emesis  . Diarrhea    HPI Jeremy Chang is a 24 y.o. male.   24 y/o male presents for diarrhea x 1 week. Admits to loose stools twice daily. Today he says he has had nausea and vomited twice. He also had hot flashes. Denies abdominal pain. He says he has been drinking, but does not have an appetite. Denies blood in stool. He denies cough, congestion, sore throat, chest pain, SOB, weakness, dysuria, penile discharge, testicular pain. He says when he was younger he had "really bad stomach problems." He denies continued issues. He says his symptoms feel like a virus. He has not taken any medication for symptoms. Denies COVID exposure and is not vaccinated. No other concerns.      Past Medical History:  Diagnosis Date  . Asthma     There are no problems to display for this patient.   History reviewed. No pertinent surgical history.     Home Medications    Prior to Admission medications   Medication Sig Start Date End Date Taking? Authorizing Provider  ondansetron (ZOFRAN) 4 MG tablet Take 1 tablet (4 mg total) by mouth every 8 (eight) hours as needed for up to 5 days for nausea or vomiting. 11/23/19 11/28/19  Eusebio Friendly B, PA-C  predniSONE (DELTASONE) 10 MG tablet Start 60 mg po day one, then 50 mg po day two, taper by 10 mg daily until complete. 09/11/19   Renford Dills, NP    Family History Family History  Problem Relation Age of Onset  . Healthy Mother   . Healthy Father     Social History Social History   Tobacco Use  . Smoking status: Former Smoker    Packs/day: 0.25    Types: Cigarettes  . Smokeless tobacco: Never Used  Vaping Use  . Vaping Use: Every day  Substance Use Topics  . Alcohol use: Not Currently  . Drug use: Yes    Types: Marijuana     Allergies   Patient has no known  allergies.   Review of Systems Review of Systems  Constitutional: Negative for fatigue and fever.  HENT: Negative for congestion, rhinorrhea, sinus pressure, sinus pain and sore throat.   Respiratory: Negative for cough and shortness of breath.   Cardiovascular: Negative for chest pain.  Gastrointestinal: Positive for diarrhea and vomiting. Negative for abdominal pain and nausea.  Genitourinary: Negative for discharge, dysuria and testicular pain.  Musculoskeletal: Negative for myalgias.  Neurological: Negative for weakness, light-headedness and headaches.  Hematological: Negative for adenopathy.     Physical Exam Triage Vital Signs ED Triage Vitals  Enc Vitals Group     BP 11/23/19 1752 123/61     Pulse Rate 11/23/19 1752 62     Resp 11/23/19 1752 18     Temp 11/23/19 1752 98.3 F (36.8 C)     Temp Source 11/23/19 1752 Oral     SpO2 11/23/19 1752 100 %     Weight 11/23/19 1748 149 lb 14.6 oz (68 kg)     Height 11/23/19 1748 5\' 5"  (1.651 m)     Head Circumference --      Peak Flow --      Pain Score 11/23/19 1748 0     Pain Loc --      Pain Edu? --  Excl. in GC? --    No data found.  Updated Vital Signs BP 123/61 (BP Location: Right Arm)   Pulse 62   Temp 98.3 F (36.8 C) (Oral)   Resp 18   Ht 5\' 5"  (1.651 m)   Wt 149 lb 14.6 oz (68 kg)   SpO2 100%   BMI 24.95 kg/m      Physical Exam Vitals and nursing note reviewed.  Constitutional:      Appearance: He is well-developed.  HENT:     Head: Normocephalic and atraumatic.     Nose: Nose normal.     Mouth/Throat:     Mouth: Mucous membranes are moist.     Pharynx: Oropharynx is clear. No posterior oropharyngeal erythema.  Eyes:     General: No scleral icterus.    Conjunctiva/sclera: Conjunctivae normal.  Cardiovascular:     Rate and Rhythm: Normal rate and regular rhythm.     Heart sounds: Normal heart sounds. No murmur heard.   Pulmonary:     Effort: Pulmonary effort is normal. No respiratory  distress.     Breath sounds: Normal breath sounds.  Abdominal:     Palpations: Abdomen is soft.     Tenderness: There is no abdominal tenderness.  Musculoskeletal:     Cervical back: Neck supple.  Skin:    General: Skin is warm and dry.  Neurological:     General: No focal deficit present.     Mental Status: He is alert. Mental status is at baseline.     Motor: No weakness.     Gait: Gait normal.  Psychiatric:        Mood and Affect: Mood normal.        Behavior: Behavior normal.        Thought Content: Thought content normal.      UC Treatments / Results  Labs (all labs ordered are listed, but only abnormal results are displayed) Labs Reviewed  SARS CORONAVIRUS 2 (TAT 6-24 HRS)    EKG   Radiology No results found.  Procedures Procedures (including critical care time)  Medications Ordered in UC Medications - No data to display  Initial Impression / Assessment and Plan / UC Course  I have reviewed the triage vital signs and the nursing notes.  Pertinent labs & imaging results that were available during my care of the patient were reviewed by me and considered in my medical decision making (see chart for details).    Final Clinical Impressions(s) / UC Diagnoses   Final diagnoses:  Gastroenteritis  Nausea vomiting and diarrhea     Discharge Instructions     Symptoms are consistent with a viral intestinal infection.  I have advised that you rest, increase fluids, take Zofran for nausea and vomiting and purchase over-the-counter Imodium for diarrhea.  If positive for Covid you have to be out of work for 10 days from symptoms start.  Follow-up for any new or worsening symptoms.    ED Prescriptions    Medication Sig Dispense Auth. Provider   ondansetron (ZOFRAN) 4 MG tablet Take 1 tablet (4 mg total) by mouth every 8 (eight) hours as needed for up to 5 days for nausea or vomiting. 15 tablet     PDMP not reviewed this encounter.     Gareth Morgan, PA-C 11/23/19 1843

## 2019-11-23 NOTE — ED Triage Notes (Signed)
Pt c/o nausea, vomiting, diarrhea, fever, sweats. denies cough, nasal congestion. He states he had diarrhea for a week but the vomiting started yesterday.

## 2019-11-24 LAB — SARS CORONAVIRUS 2 (TAT 6-24 HRS): SARS Coronavirus 2: NEGATIVE

## 2020-07-31 ENCOUNTER — Other Ambulatory Visit: Payer: Self-pay

## 2020-07-31 ENCOUNTER — Ambulatory Visit
Admission: EM | Admit: 2020-07-31 | Discharge: 2020-07-31 | Disposition: A | Discharge status: Home / Self Care | Payer: Self-pay | Attending: Family Medicine | Admitting: Family Medicine

## 2020-07-31 DIAGNOSIS — K529 Noninfective gastroenteritis and colitis, unspecified: Secondary | ICD-10-CM

## 2020-07-31 MED ORDER — ONDANSETRON 4 MG PO TBDP: 4.0000 mg | ORAL_TABLET | Freq: Three times a day (TID) | ORAL | 0 refills | Status: DC | PRN

## 2020-07-31 MED ORDER — ONDANSETRON 8 MG PO TBDP
8.0000 mg | ORAL_TABLET | Freq: Once | ORAL | Status: AC
  Administered 2020-07-31: 8 mg via ORAL

## 2020-07-31 MED ORDER — DIPHENOXYLATE-ATROPINE 2.5-0.025 MG PO TABS: 1.0000 | ORAL_TABLET | Freq: Four times a day (QID) | ORAL | 0 refills | Status: DC | PRN

## 2020-07-31 NOTE — ED Triage Notes (Signed)
Pt presents with complaints of body soreness, emesis, diarrhea, and hot and cold chills x 2 days. Reports decreased appetite.

## 2020-07-31 NOTE — ED Provider Notes (Signed)
[facility]    CSN: [id number] Arrival date & time: 07/31/20  1836      History   Chief Complaint Chief Complaint  Patient presents with  . Emesis   HPI  25 year old male presents with nausea, vomiting, diarrhea.  Symptoms started yesterday.  He reports nausea, vomiting, diarrhea.  Vomiting has subsided but he continues to have nausea and diarrhea.  Reports that he has had hot and cold chills.  No documented fever.  Reports some body aches.  Decreased appetite.  Denies abdominal pain.  No constipation.  No relieving factors.  No other complaints.  Past Medical History:  Diagnosis Date  . Asthma    Home Medications    Prior to Admission medications   Medication Sig Start Date End Date Taking? Authorizing Provider  diphenoxylate-atropine (LOMOTIL) 2.5-0.025 MG tablet Take 1 tablet by mouth 4 (four) times daily as needed for diarrhea or loose stools. 07/31/20  Yes Lexy Meininger G, DO  ondansetron (ZOFRAN ODT) 4 MG disintegrating tablet Take 1 tablet (4 mg total) by mouth every 8 (eight) hours as needed for nausea or vomiting. 07/31/20  Yes Cortlin Marano G, DO  predniSONE (DELTASONE) 10 MG tablet Start 60 mg po day one, then 50 mg po day two, taper by 10 mg daily until complete. 09/11/19   Renford Dills, NP    Family History Family History  Problem Relation Age of Onset  . Healthy Mother   . Healthy Father     Social History Social History   Tobacco Use  . Smoking status: Former Smoker    Packs/day: 0.25    Types: Cigarettes  . Smokeless tobacco: Never Used  Vaping Use  . Vaping Use: Every day  Substance Use Topics  . Alcohol use: Not Currently  . Drug use: Yes    Types: Marijuana     Allergies   Patient has no known allergies.   Review of Systems Review of Systems  Constitutional: Positive for appetite change. Negative for fever.  Gastrointestinal: Positive for diarrhea, nausea and vomiting.   Physical Exam Triage Vital Signs ED Triage Vitals   Enc Vitals Group     BP 07/31/20 1852 (!) 125/55     Pulse Rate 07/31/20 1852 75     Resp 07/31/20 1852 19     Temp 07/31/20 1852 98.7 F (37.1 C)     Temp src --      SpO2 07/31/20 1852 99 %     Weight --      Height --      Head Circumference --      Peak Flow --      Pain Score 07/31/20 1851 0     Pain Loc --      Pain Edu? --      Excl. in GC? --    Updated Vital Signs BP (!) 125/55   Pulse 75   Temp 98.7 F (37.1 C)   Resp 19   SpO2 99%   Visual Acuity Right Eye Distance:   Left Eye Distance:   Bilateral Distance:    Right Eye Near:   Left Eye Near:    Bilateral Near:     Physical Exam Vitals and nursing note reviewed.  Constitutional:      General: He is not in acute distress.    Appearance: Normal appearance. He is not ill-appearing.  HENT:     Head: Normocephalic and atraumatic.  Eyes:     General:  Right eye: No discharge.        Left eye: No discharge.     Conjunctiva/sclera: Conjunctivae normal.  Cardiovascular:     Rate and Rhythm: Normal rate and regular rhythm.  Pulmonary:     Effort: Pulmonary effort is normal.     Breath sounds: Normal breath sounds. No wheezing, rhonchi or rales.  Abdominal:     General: There is no distension.     Palpations: Abdomen is soft.     Tenderness: There is no abdominal tenderness.  Neurological:     Mental Status: He is alert.  Psychiatric:        Mood and Affect: Mood normal.        Behavior: Behavior normal.    UC Treatments / Results  Labs (all labs ordered are listed, but only abnormal results are displayed) Labs Reviewed - No data to display  EKG   Radiology No results found.  Procedures Procedures (including critical care time)  Medications Ordered in UC Medications  ondansetron (ZOFRAN-ODT) disintegrating tablet 8 mg (8 mg Oral Given 07/31/20 1909)    Initial Impression / Assessment and Plan / UC Course  I have reviewed the triage vital signs and the nursing notes.  Pertinent  labs & imaging results that were available during my care of the patient were reviewed by me and considered in my medical decision making (see chart for details).     25 year old male presents with gastroenteritis.  Treating with Zofran and Lomotil.  Final Clinical Impressions(s) / UC Diagnoses   Final diagnoses:  Gastroenteritis   Discharge Instructions   None    ED Prescriptions    Medication Sig Dispense Auth. Provider   ondansetron (ZOFRAN ODT) 4 MG disintegrating tablet Take 1 tablet (4 mg total) by mouth every 8 (eight) hours as needed for nausea or vomiting. 20 tablet Ikhlas Albo G, DO   diphenoxylate-atropine (LOMOTIL) 2.5-0.025 MG tablet Take 1 tablet by mouth 4 (four) times daily as needed for diarrhea or loose stools. 30 tablet Tommie Sams, DO     PDMP not reviewed this encounter.   Tommie Sams, [place] 07/31/20 1944

## 2021-07-30 ENCOUNTER — Ambulatory Visit
Admission: EM | Admit: 2021-07-30 | Discharge: 2021-07-30 | Disposition: A | Discharge status: Home / Self Care | Payer: Self-pay

## 2021-07-30 DIAGNOSIS — S0501XA Injury of conjunctiva and corneal abrasion without foreign body, right eye, initial encounter: Secondary | ICD-10-CM

## 2021-07-30 DIAGNOSIS — T1591XA Foreign body on external eye, part unspecified, right eye, initial encounter: Secondary | ICD-10-CM

## 2021-07-30 MED ORDER — CIPROFLOXACIN HCL 0.3 % OP SOLN: OPHTHALMIC | 0 refills | Status: DC

## 2021-07-30 MED ORDER — KETOROLAC TROMETHAMINE 0.5 % OP SOLN: 1.0000 [drp] | Freq: Four times a day (QID) | OPHTHALMIC | 0 refills | Status: DC | PRN

## 2021-07-30 NOTE — ED Provider Notes (Signed)
?[facility] ? ? ? ?CSN: [id number] ?Arrival date & time: 07/30/21  3710 ? ? ?  ? ?History   ?Chief Complaint ?Chief Complaint  ?Patient presents with  ? Eye Problem  ? ? ?HPI ?Jeremy Chang is a 26 y.o. male presenting for right eye pain and foreign body sensation.  Patient says he was weed eating yesterday and did wear eye protection.  He says he thinks he actually raise the glasses up when he was wiping sweat away and was hit in the face with what he thinks was a piece of wood.  Patient reports that he and his girlfriend cleaned a lot of grass and debris out of his eye yesterday.  Today he says it is more red and draining.  He also has some thick whitish drainage from eye.  Reports no change in his vision but he does report it is difficult to open his eye and takes him a while.  Mild photophobia.  Patient reports that he has poor vision and is supposed to wear glasses but does not.  He does not wear contact lenses either.  Has not seen a optometrist in several years.  Patient denying any severe pain, vomiting, dizziness or weakness. ? ?HPI ? ?Past Medical History:  ?Diagnosis Date  ? Asthma   ? ? ?There are no problems to display for this patient. ? ? ?History reviewed. No pertinent surgical history. ? ? ? ? ?Home Medications   ? ?Prior to Admission medications   ?Medication Sig Start Date End Date Taking? Authorizing Provider  ?ciprofloxacin (CILOXAN) 0.3 % ophthalmic solution Administer 1 drop, every 2 hours, while awake, for 2 days. Then 1 drop, every 4 hours, while awake, for the next 5 days. 07/30/21  Yes Shirlee Latch, PA-C  ?ketorolac (ACULAR) 0.5 % ophthalmic solution Place 1 drop into both eyes every 6 (six) hours as needed. 07/30/21  Yes Shirlee Latch, PA-C  ?UNABLE TO FIND Med Name: OTC Eye wash.   Yes [provider]  ?diphenoxylate-atropine (LOMOTIL) 2.5-0.025 MG tablet Take 1 tablet by mouth 4 (four) times daily as needed for diarrhea or loose stools. 07/31/20   Tommie Sams,  DO  ?ondansetron (ZOFRAN ODT) 4 MG disintegrating tablet Take 1 tablet (4 mg total) by mouth every 8 (eight) hours as needed for nausea or vomiting. 07/31/20   Tommie Sams, DO  ?predniSONE (DELTASONE) 10 MG tablet Start 60 mg po day one, then 50 mg po day two, taper by 10 mg daily until complete. 09/11/19   Renford Dills, NP  ? ? ?Family History ?Family History  ?Problem Relation Age of Onset  ? Healthy Mother   ? Healthy Father   ? ? ?Social History ?Social History  ? ?Tobacco Use  ? Smoking status: Some Days  ?  Packs/day: 0.25  ?  Types: Cigarettes  ? Smokeless tobacco: Never  ?Vaping Use  ? Vaping Use: Every day  ?Substance Use Topics  ? Alcohol use: Not Currently  ? Drug use: Yes  ?  Types: Marijuana  ? ? ? ?Allergies   ?Patient has no known allergies. ? ? ?Review of Systems ?Review of Systems  ?Eyes:  Positive for photophobia, pain, discharge (watery) and redness. Negative for itching and visual disturbance.  ?Gastrointestinal:  Negative for vomiting.  ?Skin:  Negative for wound.  ?Neurological:  Negative for dizziness and headaches.  ? ? ?Physical Exam ?Triage Vital Signs ?ED Triage Vitals  ?Enc Vitals Group  ?  BP 07/30/21 0948 116/76  ?   Pulse Rate 07/30/21 0948 61  ?   Resp 07/30/21 0948 18  ?   Temp 07/30/21 0948 98.4 ?F (36.9 ?C)  ?   Temp Source 07/30/21 0948 Oral  ?   SpO2 07/30/21 0948 99 %  ?   Weight 07/30/21 0947 155 lb (70.3 kg)  ?   Height 07/30/21 0947 5\' 6"  (1.676 m)  ?   Head Circumference --   ?   Peak Flow --   ?   Pain Score 07/30/21 0943 9  ?   Pain Loc --   ?   Pain Edu? --   ?   Excl. in GC? --   ? ?No data found. ? ?Updated Vital Signs ?BP 116/76 (BP Location: Left Arm)   Pulse 61   Temp 98.4 ?F (36.9 ?C) (Oral)   Resp 18   Ht 5\' 6"  (1.676 m)   Wt 155 lb (70.3 kg)   SpO2 99%   BMI 25.02 kg/m?  ? ?Visual Acuity ?Right Eye Distance: 20/100 uncorrected, pt normally wears glasses but does not have them today. ?Left Eye Distance: 20/70 uncorrected, pt normally wears glasses but  does not have them today. ?Bilateral Distance: UTO ?   ? ?Physical Exam ?Vitals and nursing note reviewed.  ?Constitutional:   ?   General: He is not in acute distress. ?   Appearance: Normal appearance. He is well-developed. He is not ill-appearing.  ?HENT:  ?   Head: Normocephalic and atraumatic.  ?Eyes:  ?   General: Lids are normal. Vision grossly intact. No scleral icterus.Visual field deficit: small black FB central mid cornea -able to flush out with saline.    ?   Right eye: Foreign body and discharge (small amount of thin clear drainage and small amount of thick white mucus) present.     ?   Left eye: No discharge.  ?   Extraocular Movements: Extraocular movements intact.  ?   Conjunctiva/sclera:  ?   Right eye: Right conjunctiva is injected (diffusely).  ?   Pupils: Pupils are equal, round, and reactive to light.  ?   Right eye: Corneal abrasion present.  ? ?Cardiovascular:  ?   Rate and Rhythm: Normal rate.  ?Pulmonary:  ?   Effort: Pulmonary effort is normal. No respiratory distress.  ?Abdominal:  ?   Palpations: Abdomen is soft.  ?Musculoskeletal:  ?   Cervical back: Neck supple.  ?Skin: ?   General: Skin is warm and dry.  ?   Capillary Refill: Capillary refill takes less than 2 seconds.  ?Neurological:  ?   General: No focal deficit present.  ?   Mental Status: He is alert. Mental status is at baseline.  ?   Motor: No weakness.  ?   Gait: Gait normal.  ?Psychiatric:     ?   Mood and Affect: Mood normal.     ?   Behavior: Behavior normal.  ? ? ? ?UC Treatments / Results  ?Labs ?(all labs ordered are listed, but only abnormal results are displayed) ?Labs Reviewed - No data to display ? ?EKG ? ? ?Radiology ?No results found. ? ?Procedures ?Procedures (including critical care time) ? ?Medications Ordered in UC ?Medications - No data to display ? ?Initial Impression / Assessment and Plan / UC Course  ?I have reviewed the triage vital signs and the nursing notes. ? ?Pertinent labs & imaging results that were  available during my care of the  patient were reviewed by me and considered in my medical decision making (see chart for details). ? ?26 year old male presenting for foreign body sensation of after something hit him in the eye when he was weed whacking yesterday.  He was wearing eye protection but says it might of been lifted up as he was wiping away sweat.  Patient did clean out grass and other debris with saline flush yesterday.  He reports increased redness, pain, mild photophobia and difficulty opening eye today.  Patient says he was most concerned because when he woke up he had crusting along his eye.  Vision is grossly intact but is poor.  20/100 in the right eye and 20/70 in the left eye.  Patient is supposed to wear glasses.  On exam I was able to see a small plaque foreign body in the cornea.  Patient gave consent for me to remove this.  I used saline flush under pressure and was able to flush it out.  Abrasion of cornea noted in this area as well.  No other foreign bodies noted.  No other abrasions seen.  We will treat at this time with antibiotic drops to try to prevent infection.  Also sent ketorolac eyedrops, reviewed use of ice, reviewed going to emergency department or following up with eye specialist if condition is worsening or not improving over the next several days.  Work note provided. ? ? ?Final Clinical Impressions(s) / UC Diagnoses  ? ?Final diagnoses:  ?Abrasion of right cornea, initial encounter  ?Foreign body of right eye, initial encounter  ? ? ? ?Discharge Instructions   ? ?  ?-You had a small foreign body of your eye that I was able to flush out.  It seems that you and your girlfriend did a good job of getting most of the foreign bodies out of your eye.  I did not see any more on your exam. ?- You do have a corneal abrasion.  I have sent antibiotic drops to pharmacy to try to prevent infection but if you do get yellow-green drainage or increased eye pain you should be seen again. ?-  Abrasions heal over a couple of days on their own. ?- May wear sunglasses and take ibuprofen and Tylenol as needed for pain relief.  I have also sent anti-inflammatory eyedrops called ketorolac to the pharmacy. ?-

## 2021-07-30 NOTE — Discharge Instructions (Addendum)
-  You had a small foreign body of your eye that I was able to flush out.  It seems that you and your girlfriend did a good job of getting most of the foreign bodies out of your eye.  I did not see any more on your exam. ?- You do have a corneal abrasion.  I have sent antibiotic drops to pharmacy to try to prevent infection but if you do get yellow-green drainage or increased eye pain you should be seen again. ?- Abrasions heal over a couple of days on their own. ?- May wear sunglasses and take ibuprofen and Tylenol as needed for pain relief.  I have also sent anti-inflammatory eyedrops called ketorolac to the pharmacy. ?- Use good Rx for your prescription so you can get them cheaper. ?- Follow-up with [facility] if not improving over the next 1 week or symptoms worsen. ?- Go to emergency department if you have loss of vision in your eye, severe headache or vomiting. ?

## 2021-07-30 NOTE — ED Triage Notes (Signed)
Patient is her for "injury to right eye while weed eating, got something in it and cut below it". DOI: [id number]. Time: "early during the day". Now "a lot of pain, redness, watery, some discharge this am".  ?

## 2022-10-21 ENCOUNTER — Ambulatory Visit
Admission: EM | Admit: 2022-10-21 | Discharge: 2022-10-21 | Disposition: A | Discharge status: Home / Self Care | Payer: Self-pay | Attending: Family Medicine | Admitting: Family Medicine

## 2022-10-21 DIAGNOSIS — R6889 Other general symptoms and signs: Secondary | ICD-10-CM | POA: Insufficient documentation

## 2022-10-21 DIAGNOSIS — R059 Cough, unspecified: Secondary | ICD-10-CM | POA: Insufficient documentation

## 2022-10-21 DIAGNOSIS — Z1152 Encounter for screening for COVID-19: Secondary | ICD-10-CM | POA: Insufficient documentation

## 2022-10-21 DIAGNOSIS — F17210 Nicotine dependence, cigarettes, uncomplicated: Secondary | ICD-10-CM | POA: Insufficient documentation

## 2022-10-21 LAB — SARS CORONAVIRUS 2 BY RT PCR: SARS Coronavirus 2 by RT PCR: NEGATIVE

## 2022-10-21 MED ORDER — ONDANSETRON HCL 4 MG PO TABS: 4.0000 mg | ORAL_TABLET | Freq: Three times a day (TID) | ORAL | 0 refills | Status: DC | PRN

## 2022-10-21 MED ORDER — ALBUTEROL SULFATE HFA 108 (90 BASE) MCG/ACT IN AERS: 2.0000 | INHALATION_SPRAY | RESPIRATORY_TRACT | 0 refills | Status: AC | PRN

## 2022-10-21 NOTE — ED Provider Notes (Signed)
[facility]    CSN: [id number] Arrival date & time: 10/21/22  1822      History   Chief Complaint Chief Complaint  Patient presents with   Generalized Body Aches   Emesis    HPI Jeremy Chang is a 27 y.o. male.   HPI  History obtained from the patient. Jeremy Chang presents for body aches and vomiting.  He works in a factory in a refrigerated area. He left work yesterday because he started vomiting. He vomited this morning and didn't go to work. He came here after waking up this evening.  He has bodyaches, chills and vomiting. Endorses generalized abdominal pain, headache, undocumented fever (doesn't have a thermometer to test), cough.  Took Nyquil with some relief.   Denies sore throat, diarrhea, rhinorrhea, sinus congestion, decreased appetitie. His girlfriend is well. His daughter was sick last week.      Past Medical History:  Diagnosis Date   Asthma     There are no problems to display for this patient.   History reviewed. No pertinent surgical history.     Home Medications    Prior to Admission medications   Medication Sig Start Date End Date Taking? Authorizing Provider  albuterol (VENTOLIN HFA) 108 (90 Base) MCG/ACT inhaler Inhale 2 puffs into the lungs every 4 (four) hours as needed. 10/21/22  Yes Tammi Boulier, DO  ondansetron (ZOFRAN) 4 MG tablet Take 1 tablet (4 mg total) by mouth every 8 (eight) hours as needed for nausea or vomiting. 10/21/22  Yes Wang Granada, DO  diphenoxylate-atropine (LOMOTIL) 2.5-0.025 MG tablet Take 1 tablet by mouth 4 (four) times daily as needed for diarrhea or loose stools. 07/31/20   Tommie Sams, DO  ketorolac (ACULAR) 0.5 % ophthalmic solution Place 1 drop into both eyes every 6 (six) hours as needed. 07/30/21   Shirlee Latch, PA-C  predniSONE (DELTASONE) 10 MG tablet Start 60 mg po day one, then 50 mg po day two, taper by 10 mg daily until complete. 09/11/19   Renford Dills, NP  UNABLE TO FIND Med Name:  OTC Eye wash.    [provider]    Family History Family History  Problem Relation Age of Onset   Healthy Mother    Healthy Father     Social History Social History   Tobacco Use   Smoking status: Some Days    Current packs/day: 0.25    Types: Cigarettes   Smokeless tobacco: Never  Vaping Use   Vaping status: Every Day  Substance Use Topics   Alcohol use: Not Currently   Drug use: Yes    Types: Marijuana     Allergies   Patient has no known allergies.   Review of Systems Review of Systems: negative unless otherwise stated in HPI.      Physical Exam Triage Vital Signs ED Triage Vitals  Encounter Vitals Group     BP 10/21/22 1933 129/62     Systolic BP Percentile --      Diastolic BP Percentile --      Pulse Rate 10/21/22 1933 65     Resp --      Temp 10/21/22 1933 98.7 F (37.1 C)     Temp Source 10/21/22 1933 Oral     SpO2 10/21/22 1933 100 %     Weight --      Height --      Head Circumference --      Peak Flow --  Pain Score 10/21/22 1932 0     Pain Loc --      Pain Education --      Exclude from Growth Chart --    No data found.  Updated Vital Signs BP 129/62 (BP Location: Left Arm)   Pulse 65   Temp 98.7 F (37.1 C) (Oral)   SpO2 100%   Visual Acuity Right Eye Distance:   Left Eye Distance:   Bilateral Distance:    Right Eye Near:   Left Eye Near:    Bilateral Near:     Physical Exam GEN:     alert, non-toxic appearing male in no distress    HENT:  mucus membranes moist, oropharyngeal without lesions or erythema, no tonsillar hypertrophy or exudates,  moderate erythematous edematous turbinates, clear nasal discharge EYES:   pupils equal and reactive, no scleral injection or discharge NECK:  normal ROM, , no meningismus   RESP:  no increased work of breathing, clear to auscultation bilaterally CVS:   regular rate and rhythm ABD: soft, non-tender, non-distended, active bowel sounds  Skin:   warm and dry, no rash on  visible skin    UC Treatments / Results  Labs (all labs ordered are listed, but only abnormal results are displayed) Labs Reviewed  SARS CORONAVIRUS 2 BY RT PCR    EKG   Radiology No results found.  Procedures Procedures (including critical care time)  Medications Ordered in UC Medications - No data to display  Initial Impression / Assessment and Plan / UC Course  I have reviewed the triage vital signs and the nursing notes.  Pertinent labs & imaging results that were available during my care of the patient were reviewed by me and considered in my medical decision making (see chart for details).       Pt is a 27 y.o. male who presents for 1 day of respiratory and gastrointestinal symptoms. Sherry is afebrile here without recent antipyretics. Satting well on room air. Overall pt is non-toxic appearing, well hydrated, without respiratory distress. Pulmonary exam is unremarkable.  COVID testing obtained and was negative. History consistent with  viral  illness. Discussed symptomatic treatment.  Explained lack of efficacy of antibiotics in viral disease.  Typical duration of symptoms discussed. Albuterol refills and zofran sent to pharmacy.   Return and ED precautions given and voiced understanding. Discussed MDM, treatment plan and plan for follow-up with patient who agrees with plan.     Final Clinical Impressions(s) / UC Diagnoses   Final diagnoses:  Flu-like symptoms     Discharge Instructions      Your COVID test is negative. If your were prescribed medication, stop by the pharmacy to pick them up.   You can take Tylenol and/or Ibuprofen as needed for fever reduction and pain relief.    For cough: honey 1/2 to 1 teaspoon (you can dilute the honey in water or another fluid).  You can also use guaifenesin and dextromethorphan for cough. You can use a humidifier for chest congestion and cough.  If you don't have a humidifier, you can sit in the bathroom with the hot  shower running.      For sore throat: try warm salt water gargles, Mucinex sore throat cough drops or cepacol lozenges, throat spray, warm tea or water with lemon/honey, popsicles or ice, or OTC cold relief medicine for throat discomfort. You can also purchase chloraseptic spray at the pharmacy or dollar store.   For congestion: take a daily anti-histamine like  Zyrtec, Claritin, and a oral decongestant, such as pseudoephedrine.  You can also use Flonase 1-2 sprays in each nostril daily. Afrin is also a good option, if you do not have high blood pressure.    It is important to stay hydrated: drink plenty of fluids (water, gatorade/powerade/pedialyte, juices, or teas) to keep your throat moisturized and help further relieve irritation/discomfort.    Return or go to the Emergency Department if symptoms worsen or do not improve in the next few days      ED Prescriptions     Medication Sig Dispense Auth. Provider   albuterol (VENTOLIN HFA) 108 (90 Base) MCG/ACT inhaler Inhale 2 puffs into the lungs every 4 (four) hours as needed. 6.7 g Mayce Noyes, DO   ondansetron (ZOFRAN) 4 MG tablet Take 1 tablet (4 mg total) by mouth every 8 (eight) hours as needed for nausea or vomiting. 20 tablet Katha Cabal, DO      PDMP not reviewed this encounter.   Katha Cabal, DO 10/24/22 1614

## 2022-10-21 NOTE — ED Triage Notes (Addendum)
Pt presents to UC c/o body aches and vomiting onset yesterday after work. Pt has been taking nyquil for sxs.

## 2022-10-21 NOTE — Discharge Instructions (Addendum)
Your COVID test is negative. If your were prescribed medication, stop by the pharmacy to pick them up.  You can take Tylenol and/or Ibuprofen as needed for fever reduction and pain relief.    For cough: honey 1/2 to 1 teaspoon (you can dilute the honey in water or another fluid).  You can also use guaifenesin and dextromethorphan for cough. You can use a humidifier for chest congestion and cough.  If you don't have a humidifier, you can sit in the bathroom with the hot shower running.      For sore throat: try warm salt water gargles, Mucinex sore throat cough drops or cepacol lozenges, throat spray, warm tea or water with lemon/honey, popsicles or ice, or OTC cold relief medicine for throat discomfort. You can also purchase chloraseptic spray at the pharmacy or dollar store.   For congestion: take a daily anti-histamine like Zyrtec, Claritin, and a oral decongestant, such as pseudoephedrine.  You can also use Flonase 1-2 sprays in each nostril daily. Afrin is also a good option, if you do not have high blood pressure.    It is important to stay hydrated: drink plenty of fluids (water, gatorade/powerade/pedialyte, juices, or teas) to keep your throat moisturized and help further relieve irritation/discomfort.    Return or go to the Emergency Department if symptoms worsen or do not improve in the next few days

## 2022-11-24 ENCOUNTER — Ambulatory Visit
Admission: EM | Admit: 2022-11-24 | Discharge: 2022-11-24 | Disposition: A | Discharge status: Home / Self Care | Payer: Self-pay | Attending: Physician Assistant | Admitting: Physician Assistant

## 2022-11-24 DIAGNOSIS — B349 Viral infection, unspecified: Secondary | ICD-10-CM | POA: Insufficient documentation

## 2022-11-24 DIAGNOSIS — R112 Nausea with vomiting, unspecified: Secondary | ICD-10-CM | POA: Insufficient documentation

## 2022-11-24 DIAGNOSIS — Z1152 Encounter for screening for COVID-19: Secondary | ICD-10-CM | POA: Insufficient documentation

## 2022-11-24 DIAGNOSIS — R197 Diarrhea, unspecified: Secondary | ICD-10-CM | POA: Insufficient documentation

## 2022-11-24 DIAGNOSIS — R051 Acute cough: Secondary | ICD-10-CM | POA: Insufficient documentation

## 2022-11-24 DIAGNOSIS — R1010 Upper abdominal pain, unspecified: Secondary | ICD-10-CM | POA: Insufficient documentation

## 2022-11-24 LAB — SARS CORONAVIRUS 2 BY RT PCR: SARS Coronavirus 2 by RT PCR: NEGATIVE

## 2022-11-24 MED ORDER — ONDANSETRON 8 MG PO TBDP
8.0000 mg | ORAL_TABLET | Freq: Once | ORAL | Status: AC
  Administered 2022-11-24: 8 mg via ORAL

## 2022-11-24 MED ORDER — ONDANSETRON HCL 4 MG PO TABS: 4.0000 mg | ORAL_TABLET | Freq: Four times a day (QID) | ORAL | 0 refills | Status: DC | PRN

## 2022-11-24 NOTE — ED Triage Notes (Signed)
Pt c/o emesis,diarrhea,abd pain,bodyaches & chills x1 day. States he was exposed to flu. Had 2 episodes of emesis today. Unable to keep any foods or fluids down. Has tried nyquil w/o relief.

## 2022-11-24 NOTE — ED Provider Notes (Signed)
[facility]    CSN: [id number] Arrival date & time: 11/24/22  1550      History   Chief Complaint Chief Complaint  Patient presents with   Emesis   Chills   Diarrhea        Generalized Body Aches    HPI Jeremy Chang is a 27 y.o. male presenting for upper abdominal cramping intermittent pains, nausea/vomiting, diarrhea, fatigue, body aches, chills, sore throat and mild cough that started this morning.  Patient denies fever but says he has been hot and cold.  Denies any breathing difficulty or wheezing.  States a coworker told him they had the flu.  He has not taken any OTC meds.  Medical history significant for asthma.  Reports history of vaping.  Reports occasionally smoking marijuana.  Denies ever having any symptoms similar to this with smoking marijuana.  No other complaints.  HPI  Past Medical History:  Diagnosis Date   Asthma     There are no problems to display for this patient.   History reviewed. No pertinent surgical history.     Home Medications    Prior to Admission medications   Medication Sig Start Date End Date Taking? Authorizing Provider  ondansetron (ZOFRAN) 4 MG tablet Take 1 tablet (4 mg total) by mouth every 6 (six) hours as needed for nausea or vomiting. 11/24/22  Yes Eusebio Friendly B, PA-C  albuterol (VENTOLIN HFA) 108 (90 Base) MCG/ACT inhaler Inhale 2 puffs into the lungs every 4 (four) hours as needed. 10/21/22   Brimage, Seward Meth, DO  diphenoxylate-atropine (LOMOTIL) 2.5-0.025 MG tablet Take 1 tablet by mouth 4 (four) times daily as needed for diarrhea or loose stools. 07/31/20   Tommie Sams, DO  ketorolac (ACULAR) 0.5 % ophthalmic solution Place 1 drop into both eyes every 6 (six) hours as needed. 07/30/21   Shirlee Latch, PA-C  predniSONE (DELTASONE) 10 MG tablet Start 60 mg po day one, then 50 mg po day two, taper by 10 mg daily until complete. 09/11/19   Renford Dills, NP  UNABLE TO FIND Med Name: OTC Eye wash.    [provider]    Family History Family History  Problem Relation Age of Onset   Healthy Mother    Healthy Father     Social History Social History   Tobacco Use   Smoking status: Some Days    Current packs/day: 0.25    Types: Cigarettes   Smokeless tobacco: Never  Vaping Use   Vaping status: Every Day  Substance Use Topics   Alcohol use: Not Currently   Drug use: Yes    Types: Marijuana     Allergies   Patient has no known allergies.   Review of Systems Review of Systems  Constitutional:  Positive for appetite change and chills. Negative for fatigue and fever.  HENT:  Positive for sore throat. Negative for congestion, rhinorrhea, sinus pressure and sinus pain.   Respiratory:  Positive for cough. Negative for shortness of breath.   Cardiovascular:  Negative for chest pain.  Gastrointestinal:  Positive for abdominal pain, diarrhea, nausea and vomiting.  Musculoskeletal:  Negative for myalgias.  Neurological:  Negative for weakness, light-headedness and headaches.  Hematological:  Negative for adenopathy.     Physical Exam Triage Vital Signs ED Triage Vitals [11/24/22 1557]  Encounter Vitals Group     BP      Systolic BP Percentile      Diastolic BP Percentile  Pulse      Resp 16     Temp      Temp Source Oral     SpO2      Weight      Height      Head Circumference      Peak Flow      Pain Score      Pain Loc      Pain Education      Exclude from Growth Chart    No data found.  Updated Vital Signs BP 127/78 (BP Location: Right Arm)   Pulse 72   Temp 99.3 F (37.4 C) (Oral)   Resp 16   Ht 5\' 5"  (1.651 m)   Wt 150 lb (68 kg)   SpO2 98%   BMI 24.96 kg/m       Physical Exam Vitals and nursing note reviewed.  Constitutional:      General: He is not in acute distress.    Appearance: Normal appearance. He is well-developed. He is ill-appearing.  HENT:     Head: Normocephalic and atraumatic.     Nose: Nose normal.     Mouth/Throat:      Mouth: Mucous membranes are moist.     Pharynx: Oropharynx is clear. Posterior oropharyngeal erythema present.  Eyes:     General: No scleral icterus.    Conjunctiva/sclera: Conjunctivae normal.  Cardiovascular:     Rate and Rhythm: Normal rate and regular rhythm.     Heart sounds: Normal heart sounds.  Pulmonary:     Effort: Pulmonary effort is normal. No respiratory distress.     Breath sounds: Normal breath sounds.  Abdominal:     Palpations: Abdomen is soft.     Tenderness: There is abdominal tenderness (RUQ, epigastric). There is no right CVA tenderness, left CVA tenderness or guarding.  Musculoskeletal:     Cervical back: Neck supple.  Skin:    General: Skin is warm and dry.     Capillary Refill: Capillary refill takes less than 2 seconds.  Neurological:     General: No focal deficit present.     Mental Status: He is alert. Mental status is at baseline.     Motor: No weakness.     Gait: Gait normal.  Psychiatric:        Mood and Affect: Mood normal.        Behavior: Behavior normal.      UC Treatments / Results  Labs (all labs ordered are listed, but only abnormal results are displayed) Labs Reviewed  SARS CORONAVIRUS 2 BY RT PCR    EKG   Radiology No results found.  Procedures Procedures (including critical care time)  Medications Ordered in UC Medications  ondansetron (ZOFRAN-ODT) disintegrating tablet 8 mg (8 mg Oral Given 11/24/22 1616)    Initial Impression / Assessment and Plan / UC Course  I have reviewed the triage vital signs and the nursing notes.  Pertinent labs & imaging results that were available during my care of the patient were reviewed by me and considered in my medical decision making (see chart for details).   27 year old male presents for feeling feverish with chills, fatigue, body aches, abdominal cramping, nausea/vomiting, cough and sore throat that began this morning.  Has been around a sick coworker.  Vitals are normal and  stable.  He is ill-appearing but nontoxic.  On exam has mild posterior pharyngeal erythema.  Abdomen soft with tenderness to palpation of the right upper quadrant (negative Murphy sign) and  epigastric region.  No guarding or rebound.  Chest clear.  Patient given 4 mg ODT Zofran for acute nausea.  After 30 minutes, patient did report that this helped his symptoms.  Stating abdominal pain is a 3 out of 10.  COVID test ordered.  COVID-negative.  Likely viral illness.  Low suspicion for influenza given that it is not flu season and he is afebrile.    We did discuss obtaining lab work since he has right upper quadrant pain and epigastric pain.  Would like to assess for any signs of potential cholelithiasis/cholecystitis, pancreatitis.  He declined stating that he has a fear of needles.  He denies any heavy alcohol use and has never had any history of cholecystitis/cholelithiasis or pancreatitis.  We discussed the importance of very close monitoring and in the case that he develops a fever or has worsening abdominal pain he is to go to the emergency department.  Supportive care at this time.  Sent Zofran to pharmacy.  Work note given.  He is agreeable to plan.   Final Clinical Impressions(s) / UC Diagnoses   Final diagnoses:  Viral illness  Upper abdominal pain  Nausea vomiting and diarrhea  Acute cough     Discharge Instructions      ABDOMINAL PAIN: You may take Tylenol for pain relief. Use medications as directed including antiemetics and antidiarrheal medications if suggested or prescribed. You should increase fluids and electrolytes as well as rest over these next several days. If you have any questions or concerns, or if your symptoms are not improving or if especially if they acutely worsen, please call or stop back to the clinic immediately and we will be happy to help you or go to the ER   ABDOMINAL PAIN RED FLAGS: Seek immediate further care if: symptoms remain the same or worsen over the  next 3-7 days, you are unable to keep fluids down, you see blood or mucus in your stool, you vomit black or dark red material, you have a fever of 101.F or higher, you have localized and/or persistent abdominal pain       ED Prescriptions     Medication Sig Dispense Auth. Provider   ondansetron (ZOFRAN) 4 MG tablet Take 1 tablet (4 mg total) by mouth every 6 (six) hours as needed for nausea or vomiting. 12 tablet Gareth Morgan      PDMP not reviewed this encounter.   Shirlee Latch, PA-C 11/24/22 1708

## 2022-11-24 NOTE — Discharge Instructions (Signed)

## 2023-08-13 ENCOUNTER — Encounter: Payer: Self-pay | Admitting: Emergency Medicine

## 2023-08-13 ENCOUNTER — Ambulatory Visit
Admission: EM | Admit: 2023-08-13 | Discharge: 2023-08-13 | Disposition: A | Discharge status: Home / Self Care | Payer: Self-pay | Attending: Emergency Medicine | Admitting: Emergency Medicine

## 2023-08-13 DIAGNOSIS — J069 Acute upper respiratory infection, unspecified: Secondary | ICD-10-CM | POA: Insufficient documentation

## 2023-08-13 LAB — RESP PANEL BY RT-PCR (FLU A&B, COVID) ARPGX2
Influenza A by PCR: NEGATIVE
Influenza B by PCR: NEGATIVE
SARS Coronavirus 2 by RT PCR: NEGATIVE

## 2023-08-13 LAB — GROUP A STREP BY PCR: Group A Strep by PCR: NOT DETECTED

## 2023-08-13 MED ORDER — BENZONATATE 100 MG PO CAPS: 200.0000 mg | ORAL_CAPSULE | Freq: Three times a day (TID) | ORAL | 0 refills | Status: AC

## 2023-08-13 MED ORDER — IPRATROPIUM BROMIDE 0.06 % NA SOLN: 2.0000 | Freq: Four times a day (QID) | NASAL | 12 refills | Status: AC

## 2023-08-13 MED ORDER — PROMETHAZINE-DM 6.25-15 MG/5ML PO SYRP: 5.0000 mL | ORAL_SOLUTION | Freq: Four times a day (QID) | ORAL | 0 refills | Status: AC | PRN

## 2023-08-13 NOTE — Discharge Instructions (Signed)
 Your testing today was negative for COVID, influenza, and strep.  Based upon the duration of symptoms and your physical exam I do believe you have a respiratory virus causing your symptoms.  Use over-the-counter Tylenol and/or ibuprofen  according the package instructions as needed for any fever or pain.  To soothe your throat you may use over-the-counter Chloraseptic or Sucrets lozenges, no more than 1 lozenge every 2 hours as the menthol may give you diarrhea.  You may also gargle with warm salt water as often as you would like.  Mix 1 tablespoon of table salt in 8 ounces of warm water, gargle and spit.  Use the Atrovent nasal spray, 2 squirts in each nostril every 6 hours, as needed for runny nose and postnasal drip.  Use the Tessalon  Perles every 8 hours during the day.  Take them with a small sip of water.  They may give you some numbness to the base of your tongue or a metallic taste in your mouth, this is normal.  Use the Promethazine  DM cough syrup at bedtime for cough and congestion.  It will make you drowsy so do not take it during the day.  Return for reevaluation or see your primary care provider for any new or worsening symptoms.

## 2023-08-13 NOTE — ED Triage Notes (Signed)
 Pt c/o nasal congestion, runny nose, sore throat, chest tightness, chills, sweats, and cough. Started yesterday. Unsure if he has had fever.

## 2023-08-13 NOTE — ED Provider Notes (Signed)
 [facility]    CSN: [id number] Arrival date & time: 08/13/23  1316      History   Chief Complaint Chief Complaint  Patient presents with   Nasal Congestion   Sore Throat    HPI Jeremy Chang is a 28 y.o. male.   HPI  28 year old male with past medical history significant for asthma presents for evaluation of respiratory symptoms that began yesterday and include runny nose, nasal congestion, chills, sweats, sore throat, chest tightness, and nonproductive cough.  He denies any fever, shortness breath, wheezing, GI complaints, recent travel.  Both of his children had influenza last week.  Past Medical History:  Diagnosis Date   Asthma     There are no active problems to display for this patient.   History reviewed. No pertinent surgical history.     Home Medications    Prior to Admission medications   Medication Sig Start Date End Date Taking? Authorizing Provider  benzonatate  (TESSALON ) 100 MG capsule Take 2 capsules (200 mg total) by mouth every 8 (eight) hours. 08/13/23  Yes Kent Pear, NP  ipratropium (ATROVENT) 0.06 % nasal spray Place 2 sprays into both nostrils 4 (four) times daily. 08/13/23  Yes Kent Pear, NP  promethazine -dextromethorphan (PROMETHAZINE -DM) 6.25-15 MG/5ML syrup Take 5 mLs by mouth 4 (four) times daily as needed. 08/13/23  Yes Kent Pear, NP  albuterol  (VENTOLIN  HFA) 108 (90 Base) MCG/ACT inhaler Inhale 2 puffs into the lungs every 4 (four) hours as needed. 10/21/22   Brimage, Vondra, DO  UNABLE TO FIND Med Name: OTC Eye wash.    [provider]    Family History Family History  Problem Relation Age of Onset   Healthy Mother    Healthy Father     Social History Social History   Tobacco Use   Smoking status: Some Days    Current packs/day: 0.25    Types: Cigarettes, E-cigarettes   Smokeless tobacco: Never  Vaping Use   Vaping status: Every Day  Substance Use Topics   Alcohol use: Not Currently   Drug  use: Yes    Types: Marijuana     Allergies   Patient has no known allergies.   Review of Systems Review of Systems  Constitutional:  Positive for chills and diaphoresis. Negative for fever.  HENT:  Positive for congestion, rhinorrhea and sore throat. Negative for ear pain.   Respiratory:  Positive for cough. Negative for shortness of breath and wheezing.   Gastrointestinal:  Negative for diarrhea, nausea and vomiting.     Physical Exam Triage Vital Signs ED Triage Vitals [08/13/23 1357]  Encounter Vitals Group     BP      Systolic BP Percentile      Diastolic BP Percentile      Pulse      Resp      Temp      Temp src      SpO2      Weight 149 lb 14.6 oz (68 kg)     Height 5\' 5"  (1.651 m)     Head Circumference      Peak Flow      Pain Score 4     Pain Loc      Pain Education      Exclude from Growth Chart    No data found.  Updated Vital Signs BP 117/63 (BP Location: Right Arm)   Pulse 70   Temp 98.6 F (37 C) (Oral)   Resp 18  Ht 5\' 5"  (1.651 m)   Wt 149 lb 14.6 oz (68 kg)   SpO2 98%   BMI 24.95 kg/m   Visual Acuity Right Eye Distance:   Left Eye Distance:   Bilateral Distance:    Right Eye Near:   Left Eye Near:    Bilateral Near:     Physical Exam Vitals and nursing note reviewed.  Constitutional:      Appearance: Normal appearance. He is ill-appearing.  HENT:     Head: Normocephalic and atraumatic.     Right Ear: Tympanic membrane, ear canal and external ear normal. There is no impacted cerumen.     Left Ear: Tympanic membrane, ear canal and external ear normal. There is no impacted cerumen.     Nose: Congestion and rhinorrhea present.     Comments: Nasal mucosa is edematous and erythematous with copious clear discharge in both nares.    Mouth/Throat:     Mouth: Mucous membranes are moist.     Pharynx: Oropharynx is clear. Posterior oropharyngeal erythema present. No oropharyngeal exudate.     Comments: Mild erythema to the posterior  oropharynx with clear postnasal drip.  Tonsillar pillars are unremarkable. Cardiovascular:     Rate and Rhythm: Normal rate and regular rhythm.     Pulses: Normal pulses.     Heart sounds: Normal heart sounds. No murmur heard.    No friction rub. No gallop.  Pulmonary:     Effort: Pulmonary effort is normal.     Breath sounds: Normal breath sounds. No wheezing, rhonchi or rales.  Musculoskeletal:     Cervical back: Normal range of motion and neck supple. No tenderness.  Lymphadenopathy:     Cervical: No cervical adenopathy.  Skin:    General: Skin is warm and dry.     Capillary Refill: Capillary refill takes less than 2 seconds.     Findings: No rash.  Neurological:     General: No focal deficit present.     Mental Status: He is alert and oriented to person, place, and time.      UC Treatments / Results  Labs (all labs ordered are listed, but only abnormal results are displayed) Labs Reviewed  GROUP A STREP BY PCR  RESP PANEL BY RT-PCR (FLU A&B, COVID) [id number]    EKG   Radiology No results found.  Procedures Procedures (including critical care time)  Medications Ordered in UC Medications - No data to display  Initial Impression / Assessment and Plan / UC Course  I have reviewed the triage vital signs and the nursing notes.  Pertinent labs & imaging results that were available during my care of the patient were reviewed by me and considered in my medical decision making (see chart for details).   Patient is a pleasant, though mildly ill-appearing, 28 year old male presenting for evaluation of flulike symptoms as outlined in HPI above.  He did have his flu exposure last week as both of his children were positive for influenza.  His upper respiratory tract reveals inflamed nasal mucosa with clear nasal discharge.  He also has edema to the posterior pharynx with clear postnasal drip.  He reports that his sore throat was worse last night and is better today.  He does not  have any tonsillar hypertrophy, erythema, or exudate.  However, I will order a strep PCR to rule out the presence of strep.  I will also order a COVID and flu PCR.  Strep PCR is negative.  Respiratory panel is negative  for COVID or influenza.  I will discharge the patient home with a diagnosis of viral URI with a cough with prescription for Atrovent nasal spray for his nasal congestion and Tessalon  Perles Promethazine  DM cough syrup for cough and congestion.  He may gargle with warm salt water to soothe his throat or use over-the-counter Chloraseptic or Sucrets lozenges.  Return precautions reviewed.  Work note provided.   Final Clinical Impressions(s) / UC Diagnoses   Final diagnoses:  Viral URI with cough     Discharge Instructions      Your testing today was negative for COVID, influenza, and strep.  Based upon the duration of symptoms and your physical exam I do believe you have a respiratory virus causing your symptoms.  Use over-the-counter Tylenol and/or ibuprofen  according the package instructions as needed for any fever or pain.  To soothe your throat you may use over-the-counter Chloraseptic or Sucrets lozenges, no more than 1 lozenge every 2 hours as the menthol may give you diarrhea.  You may also gargle with warm salt water as often as you would like.  Mix 1 tablespoon of table salt in 8 ounces of warm water, gargle and spit.  Use the Atrovent nasal spray, 2 squirts in each nostril every 6 hours, as needed for runny nose and postnasal drip.  Use the Tessalon  Perles every 8 hours during the day.  Take them with a small sip of water.  They may give you some numbness to the base of your tongue or a metallic taste in your mouth, this is normal.  Use the Promethazine  DM cough syrup at bedtime for cough and congestion.  It will make you drowsy so do not take it during the day.  Return for reevaluation or see your primary care provider for any new or worsening symptoms.     ED Prescriptions     Medication Sig Dispense Auth. Provider   benzonatate  (TESSALON ) 100 MG capsule Take 2 capsules (200 mg total) by mouth every 8 (eight) hours. 21 capsule Kent Pear, NP   ipratropium (ATROVENT) 0.06 % nasal spray Place 2 sprays into both nostrils 4 (four) times daily. 15 mL Kent Pear, NP   promethazine -dextromethorphan (PROMETHAZINE -DM) 6.25-15 MG/5ML syrup Take 5 mLs by mouth 4 (four) times daily as needed. 118 mL Kent Pear, NP      PDMP not reviewed this encounter.   Kent Pear, NP 08/13/23 1446
